# Patient Record
Sex: Male | Born: 1955 | Race: White | Hispanic: No | Marital: Married | Smoking: Never smoker
Health system: Southern US, Community
[De-identification: ages and names within clinical notes are randomized; demographics above are authoritative.]

## PROBLEM LIST (undated history)

## (undated) DIAGNOSIS — E119 Type 2 diabetes mellitus without complications: Secondary | ICD-10-CM

## (undated) DIAGNOSIS — F419 Anxiety disorder, unspecified: Secondary | ICD-10-CM

## (undated) DIAGNOSIS — N189 Chronic kidney disease, unspecified: Secondary | ICD-10-CM

## (undated) DIAGNOSIS — F32A Depression, unspecified: Secondary | ICD-10-CM

## (undated) DIAGNOSIS — Z87442 Personal history of urinary calculi: Secondary | ICD-10-CM

## (undated) DIAGNOSIS — I10 Essential (primary) hypertension: Secondary | ICD-10-CM

## (undated) HISTORY — PX: PROSTATECTOMY: SHX69

## (undated) HISTORY — PX: JOINT REPLACEMENT: SHX530

## (undated) HISTORY — PX: HERNIA REPAIR: SHX51

---

## 2020-04-04 ENCOUNTER — Other Ambulatory Visit (HOSPITAL_COMMUNITY): Payer: Self-pay | Admitting: Student

## 2020-04-04 DIAGNOSIS — R8290 Unspecified abnormal findings in urine: Secondary | ICD-10-CM

## 2020-04-04 DIAGNOSIS — N182 Chronic kidney disease, stage 2 (mild): Secondary | ICD-10-CM

## 2020-04-04 DIAGNOSIS — R809 Proteinuria, unspecified: Secondary | ICD-10-CM

## 2020-04-04 DIAGNOSIS — R319 Hematuria, unspecified: Secondary | ICD-10-CM

## 2020-04-16 ENCOUNTER — Ambulatory Visit (HOSPITAL_COMMUNITY)
Admission: RE | Admit: 2020-04-16 | Discharge: 2020-04-16 | Disposition: A | Discharge status: Home / Self Care | Payer: Medicare Other | Source: Ambulatory Visit | Attending: Student | Admitting: Student

## 2020-04-16 ENCOUNTER — Other Ambulatory Visit: Payer: Self-pay

## 2020-04-16 DIAGNOSIS — R319 Hematuria, unspecified: Secondary | ICD-10-CM | POA: Diagnosis present

## 2020-04-16 DIAGNOSIS — R8290 Unspecified abnormal findings in urine: Secondary | ICD-10-CM | POA: Insufficient documentation

## 2020-04-16 DIAGNOSIS — N182 Chronic kidney disease, stage 2 (mild): Secondary | ICD-10-CM | POA: Diagnosis present

## 2020-04-16 DIAGNOSIS — R809 Proteinuria, unspecified: Secondary | ICD-10-CM | POA: Diagnosis present

## 2020-04-23 ENCOUNTER — Other Ambulatory Visit: Payer: Self-pay | Admitting: Nephrology

## 2020-04-23 DIAGNOSIS — R809 Proteinuria, unspecified: Secondary | ICD-10-CM

## 2020-04-26 NOTE — Progress Notes (Signed)
Patient on schedule for Renal biopsy 3/16, spoke with patient's wife on phone with pre procedure instructions given. Made aware to be here @ 0730 , NPO after MN as well as driver for discharge post procedure/recovery, holding am insulin,metformin, 1/2 dose hs prior to procedure. Stated understanding.

## 2020-04-30 ENCOUNTER — Other Ambulatory Visit: Payer: Self-pay | Admitting: Student

## 2020-05-01 ENCOUNTER — Other Ambulatory Visit: Payer: Self-pay

## 2020-05-01 ENCOUNTER — Ambulatory Visit
Admission: RE | Admit: 2020-05-01 | Discharge: 2020-05-01 | Disposition: A | Discharge status: Home / Self Care | Payer: Medicare Other | Source: Ambulatory Visit | Attending: Nephrology | Admitting: Nephrology

## 2020-05-01 DIAGNOSIS — Z79899 Other long term (current) drug therapy: Secondary | ICD-10-CM | POA: Insufficient documentation

## 2020-05-01 DIAGNOSIS — E119 Type 2 diabetes mellitus without complications: Secondary | ICD-10-CM | POA: Insufficient documentation

## 2020-05-01 DIAGNOSIS — R809 Proteinuria, unspecified: Secondary | ICD-10-CM | POA: Diagnosis present

## 2020-05-01 DIAGNOSIS — Z794 Long term (current) use of insulin: Secondary | ICD-10-CM | POA: Insufficient documentation

## 2020-05-01 DIAGNOSIS — Z880 Allergy status to penicillin: Secondary | ICD-10-CM | POA: Diagnosis not present

## 2020-05-01 DIAGNOSIS — N269 Renal sclerosis, unspecified: Secondary | ICD-10-CM | POA: Insufficient documentation

## 2020-05-01 HISTORY — DX: Essential (primary) hypertension: I10

## 2020-05-01 HISTORY — DX: Type 2 diabetes mellitus without complications: E11.9

## 2020-05-01 HISTORY — DX: Anxiety disorder, unspecified: F41.9

## 2020-05-01 HISTORY — DX: Depression, unspecified: F32.A

## 2020-05-01 HISTORY — DX: Chronic kidney disease, unspecified: N18.9

## 2020-05-01 HISTORY — DX: Personal history of urinary calculi: Z87.442

## 2020-05-01 LAB — CBC
HCT: 45.8 % (ref 39.0–52.0)
Hemoglobin: 15.7 g/dL (ref 13.0–17.0)
MCH: 32.6 pg (ref 26.0–34.0)
MCHC: 34.3 g/dL (ref 30.0–36.0)
MCV: 95.2 fL (ref 80.0–100.0)
Platelets: 324 10*3/uL (ref 150–400)
RBC: 4.81 MIL/uL (ref 4.22–5.81)
RDW: 13.3 % (ref 11.5–15.5)
WBC: 11 10*3/uL — ABNORMAL HIGH (ref 4.0–10.5)
nRBC: 0 % (ref 0.0–0.2)

## 2020-05-01 LAB — GLUCOSE, CAPILLARY: Glucose-Capillary: 113 mg/dL — ABNORMAL HIGH (ref 70–99)

## 2020-05-01 LAB — PROTIME-INR
INR: 1 (ref 0.8–1.2)
Prothrombin Time: 13.2 seconds (ref 11.4–15.2)

## 2020-05-01 MED ORDER — MIDAZOLAM HCL 2 MG/2ML IJ SOLN
INTRAMUSCULAR | Status: AC
  Filled 2020-05-01: qty 2

## 2020-05-01 MED ORDER — MIDAZOLAM HCL 2 MG/2ML IJ SOLN
INTRAMUSCULAR | Status: AC | PRN
  Administered 2020-05-01 (×2): 1 mg via INTRAVENOUS

## 2020-05-01 MED ORDER — FENTANYL CITRATE (PF) 100 MCG/2ML IJ SOLN
INTRAMUSCULAR | Status: AC | PRN
Start: 2020-05-01 — End: 2020-05-01
  Administered 2020-05-01 (×2): 50 ug via INTRAVENOUS

## 2020-05-01 MED ORDER — SODIUM CHLORIDE 0.9 % IV SOLN: INTRAVENOUS | Status: DC

## 2020-05-01 MED ORDER — FENTANYL CITRATE (PF) 100 MCG/2ML IJ SOLN
INTRAMUSCULAR | Status: AC
  Filled 2020-05-01: qty 2

## 2020-05-01 NOTE — Discharge Instructions (Signed)
Liver Biopsy, Care After These instructions give you information about how to care for yourself after your procedure. Your health care provider may also give you more specific instructions. If you have problems or questions, contact your health care provider. What can I expect after the procedure? After your procedure, it is common to have:  Pain and soreness in the area where the biopsy was done.  Bruising around the area where the biopsy was done.  Sleepiness and fatigue for 1-2 days. Follow these instructions at home: Medicines  Take over-the-counter and prescription medicines only as told by your health care provider.  If you were prescribed an antibiotic medicine, take it as told by your health care provider. Do not stop taking the antibiotic even if you start to feel better.  Do not take medicines such as aspirin and ibuprofen unless your health care provider tells you to take them. These medicines thin your blood and can increase the risk of bleeding.  If you are taking prescription pain medicine, take actions to prevent or treat constipation. Your health care provider may recommend that you: ? Drink enough fluid to keep your urine pale yellow. ? Eat foods that are high in fiber, such as fresh fruits and vegetables, whole grains, and beans. ? Limit foods that are high in fat and processed sugars, such as fried or sweet foods. ? Take an over-the-counter or prescription medicine for constipation. Incision care  Follow instructions from your health care provider about how to take care of your incision. Make sure you: ? Wash your hands with soap and water before you change your bandage (dressing). If soap and water are not available, use hand sanitizer. ? Change your dressing as told by your health care provider. ? Leave stitches (sutures), skin glue, or adhesive strips in place. These skin closures may need to stay in place for 2 weeks or longer. If adhesive strip edges start to  loosen and curl up, you may trim the loose edges. Do not remove adhesive strips completely unless your health care provider tells you to do that.  Check your incision area every day for signs of infection. Check for: ? Redness, swelling, or pain. ? Fluid or blood. ? Warmth. ? Pus or a bad smell.  Do not take baths, swim, or use a hot tub until your health care provider says it is okay to do so. Activity  Rest at home for 1-2 days, or as directed by your health care provider. ? Avoid sitting for a long time without moving. Get up to take short walks every 1-2 hours. This is important to improve blood flow and breathing. Ask for help if you feel weak or unsteady.  Return to your normal activities as told by your health care provider. Ask your health care provider what activities are safe for you.  Do not drive or use heavy machinery while taking prescription pain medicine.  Do not lift anything that is heavier than 10 lb (4.5 kg), or the limit that your health care provider tells you, until he or she says that it is safe.  Do not play contact sports for 2 weeks after the procedure.   General instructions  Do not drink alcohol in the first week after the procedure.  Have someone stay with you for at least 24 hours after the procedure.  It is your responsibility to obtain your test results. Ask your health care provider, or the department that is doing the test: ? When will my   results be ready? ? How will I get my results? ? What are my treatment options? ? What other tests do I need? ? What are my next steps?  Keep all follow-up visits as told by your health care provider. This is important.   Contact a health care provider if:  You have increased bleeding from an incision, resulting in more than a small spot of blood.  You have redness, swelling, or increasing pain in any incisions.  You notice a discharge or a bad smell coming from any of your incisions.  You have a fever or  chills. Get help right away if:  You develop swelling, bloating, or pain in your abdomen.  You become dizzy or faint.  You develop a rash.  You have nausea or you vomit.  You faint, or you have shortness of breath or difficulty breathing.  You develop chest pain.  You have problems with your speech or vision.  You have trouble with your balance or moving your arms or legs. Summary  After the liver biopsy, it is common to have pain, soreness, and bruising in the area, as well as sleepiness and fatigue.  Take over-the-counter and prescription medicines only as told by your health care provider.  Follow instructions from your health care provider about how to care for your incision. Check the incision area daily for signs of infection. This information is not intended to replace advice given to you by your health care provider. Make sure you discuss any questions you have with your health care provider. Document Revised: 03/28/2018 Document Reviewed: 02/12/2017 Elsevier Patient Education  2021 Elsevier Inc.  

## 2020-05-01 NOTE — Progress Notes (Signed)
Patient clinically stable post Renal biopsy per DR Suttle, tolerated well with stable vitals pre and post procedure. Awake/alert and oriented post procedure. Denies complaints at this time. Report given to [facility] post procedure/specials.

## 2020-05-01 NOTE — Procedures (Signed)
Interventional Radiology Procedure Note  Procedure: Ultrasound guided non-focal renal biopsy  Findings: Please refer to procedural dictation for full description. Left lower pole 16 ga core x2.  Gelfoam needle track embolization.  Complications: None immeidate  Estimated Blood Loss: < 5 mL  Recommendations: Strict 3 hours bedrest. Follow up Pathology results.   Marliss Coots, MD

## 2020-05-01 NOTE — H&P (Signed)
Chief Complaint: Patient was seen in consultation today for kidney biopsy  Referring Physician(s): Kolluru,Sarath  Patient Status: [facility] - Out-pt  History of Present Illness: Stuart Mosley is a 65 y.o. male with history of proteinuria presenting for non-focal renal biopsy.  Denies fevers, chills, chest pain, shortness of breath, nausea, vomiting, abdominal pain, hematuria.  Past Medical History:  Diagnosis Date  . Anxiety   . Chronic kidney disease   . Depression   . Diabetes mellitus without complication (HCC)   . History of kidney stones   . Hypertension     Past Surgical History:  Procedure Laterality Date  . HERNIA REPAIR    . JOINT REPLACEMENT     left hip  . PROSTATECTOMY      Allergies: Penicillins and Tetracyclines & related  Medications: Prior to Admission medications   Medication Sig Start Date End Date Taking? Authorizing Provider  colchicine 0.6 MG tablet Take 0.6 mg by mouth daily.   Yes [provider]  FLAXSEED, LINSEED, PO Take by mouth.   Yes [provider]  fluticasone (FLONASE) 50 MCG/ACT nasal spray Place into both nostrils daily.   Yes [provider]  folic acid (FOLVITE) 1 MG tablet Take 1 mg by mouth daily.   Yes [provider]  insulin glargine (LANTUS) 100 UNIT/ML injection Inject 41 Units into the skin daily.   Yes [provider]  lisinopril (ZESTRIL) 10 MG tablet Take 10 mg by mouth daily.   Yes [provider]  metFORMIN (GLUMETZA) 1000 MG (MOD) 24 hr tablet Take 1,000 mg by mouth 2 (two) times daily with a meal.   Yes [provider]  pravastatin (PRAVACHOL) 80 MG tablet Take 80 mg by mouth daily.   Yes [provider]     History reviewed. No pertinent family history.  Social History   Socioeconomic History  . Marital status: Married    Spouse name: Not on file  . Number of children: Not on file  . Years of education: Not on file  . Highest education  level: Not on file  Occupational History  . Not on file  Tobacco Use  . Smoking status: Never Smoker  . Smokeless tobacco: Never Used  Substance and Sexual Activity  . Alcohol use: Not Currently  . Drug use: Never  . Sexual activity: Not on file  Other Topics Concern  . Not on file  Social History Narrative  . Not on file   Social Determinants of Health   Financial Resource Strain: Not on file  Food Insecurity: Not on file  Transportation Needs: Not on file  Physical Activity: Not on file  Stress: Not on file  Social Connections: Not on file   Review of Systems: A 12 point ROS discussed and pertinent positives are indicated in the HPI above.  All other systems are negative.  Vital Signs: BP (!) 156/97   Pulse 81   Temp 98.4 F (36.9 C) (Oral)   Resp 17   Ht 5\' 9"  (1.753 m)   Wt 116.6 kg   SpO2 97%   BMI 37.95 kg/m   Physical Exam Constitutional:      General: He is not in acute distress. HENT:     Head: Normocephalic.     Mouth/Throat:     Mouth: Mucous membranes are moist.     Comments: MP2 Cardiovascular:     Rate and Rhythm: Normal rate and regular rhythm.     Heart sounds: Normal heart sounds.  Pulmonary:     Effort: Pulmonary effort is normal. No respiratory distress.     Breath sounds: Normal breath sounds.  Abdominal:     General: There is no distension.  Musculoskeletal:        General: No swelling.  Skin:    General: Skin is warm and dry.  Neurological:     Mental Status: He is alert and oriented to person, place, and time.     Imaging: US RENAL  Result Date: 04/17/2020 CLINICAL DATA:  Initial evaluation for chronic kidney disease, abnormal urine findings. EXAM: RENAL / URINARY TRACT ULTRASOUND COMPLETE COMPARISON:  Prior ultrasound from 01/02/2016. FINDINGS: Right Kidney: Renal measurements: 13.4 x 7.2 x 6.4 cm = volume: 323.6 mL. Renal echogenicity within normal limits. No nephrolithiasis or hydronephrosis. 2.0 x 1.5 x 1.9 cm simple cyst  present at the lower pole. Additional 1.3 x 1.0 x 1.1 cm simple cyst present at the interpolar region. Left Kidney: Renal measurements: 12.4 x 6.5 x 6.5 cm = volume: 232.5 mL. Renal echogenicity within normal limits. No nephrolithiasis or hydronephrosis. No focal renal mass. Bladder: Decompressed and not well assessed. Other: None. IMPRESSION: 1. Two simple right renal cysts measuring up to 2.0 cm as above. 2. Otherwise unremarkable and normal renal ultrasound. No sonographic evidence for nephrolithiasis or hydronephrosis. Electronically Signed   By: Rise Mu M.D.   On: 04/17/2020 04:44    Labs:  CBC: No results for input(s): WBC, HGB, HCT, PLT in the last 8760 hours.  COAGS: No results for input(s): INR, APTT in the last 8760 hours.  BMP: No results for input(s): NA, K, CL, CO2, GLUCOSE, BUN, CALCIUM, CREATININE, GFRNONAA, GFRAA in the last 8760 hours.  Invalid input(s): CMP  LIVER FUNCTION TESTS: No results for input(s): BILITOT, AST, ALT, ALKPHOS, PROT, ALBUMIN in the last 8760 hours.  TUMOR MARKERS: No results for input(s): AFPTM, CEA, CA199, CHROMGRNA in the last 8760 hours.  Assessment and Plan:  65 year old male with history of proteinuria.  Plan for ultrasound guided non-focal renal biopsy with moderate sedation.   Electronically Signed: Bennie Dallas, MD 05/01/2020, 8:02 AM   I spent a total of  15 Minutes in face to face in clinical consultation, greater than 50% of which was counseling/coordinating care for renal biopsy.

## 2020-05-02 LAB — GLUCOSE, CAPILLARY: Glucose-Capillary: 33 mg/dL — CL (ref 70–99)

## 2020-06-12 ENCOUNTER — Other Ambulatory Visit: Payer: Self-pay | Admitting: Family Medicine

## 2020-06-12 ENCOUNTER — Other Ambulatory Visit (HOSPITAL_COMMUNITY): Payer: Self-pay | Admitting: Family Medicine

## 2020-06-12 DIAGNOSIS — N5089 Other specified disorders of the male genital organs: Secondary | ICD-10-CM

## 2020-06-14 ENCOUNTER — Ambulatory Visit (HOSPITAL_COMMUNITY)
Admission: RE | Admit: 2020-06-14 | Discharge: 2020-06-14 | Disposition: A | Discharge status: Home / Self Care | Payer: Medicare Other | Source: Ambulatory Visit | Attending: Family Medicine | Admitting: Family Medicine

## 2020-06-14 ENCOUNTER — Other Ambulatory Visit: Payer: Self-pay

## 2020-06-14 DIAGNOSIS — N5089 Other specified disorders of the male genital organs: Secondary | ICD-10-CM | POA: Insufficient documentation

## 2020-08-01 ENCOUNTER — Encounter: Payer: Self-pay | Admitting: Nephrology

## 2020-08-01 LAB — SURGICAL PATHOLOGY

## 2021-04-23 IMAGING — US US BIOPSY
1 series · 7 of 7 positions shown · non-contrast
Comparison: None.

INDICATION: 64-year-old male with history of proteinuria.

EXAM:
ULTRASOUND GUIDED RENAL BIOPSY

[Series 1: us biopsy · 7 of 7 slices shown]
[im 1/7]
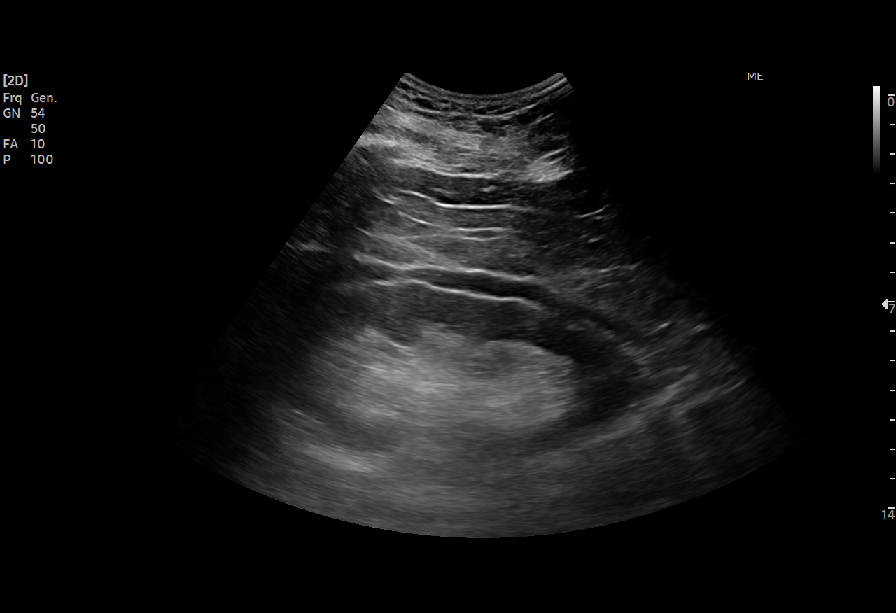
[im 2/7]
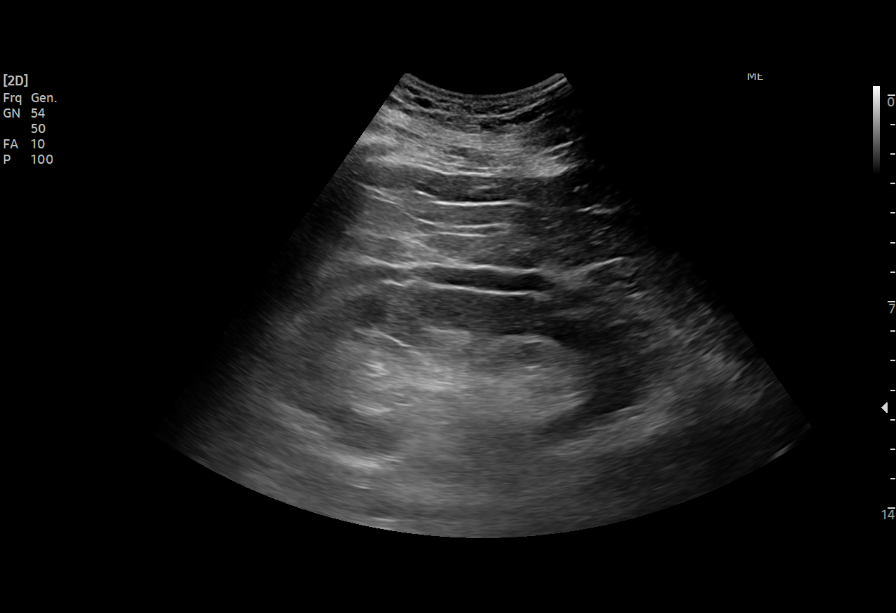
[im 3/7]
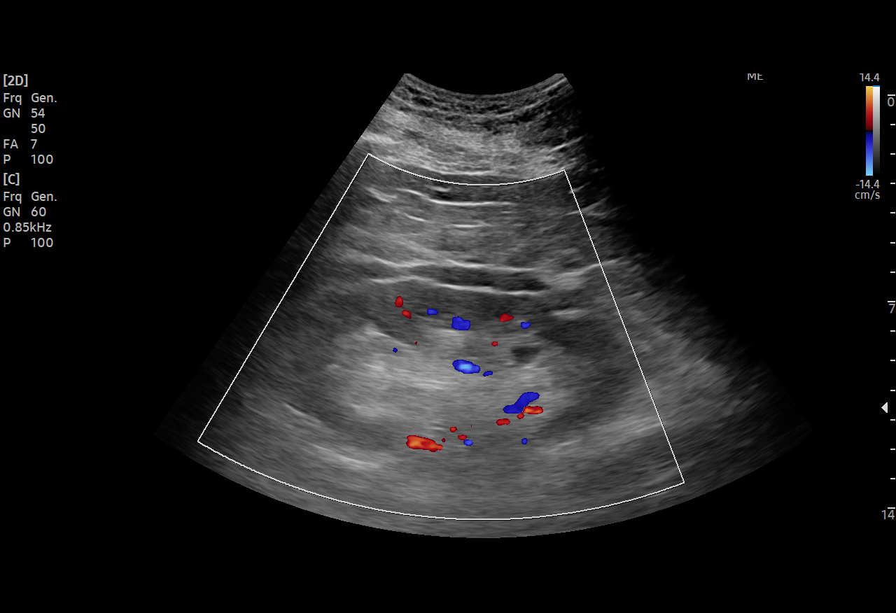
[im 4/7]
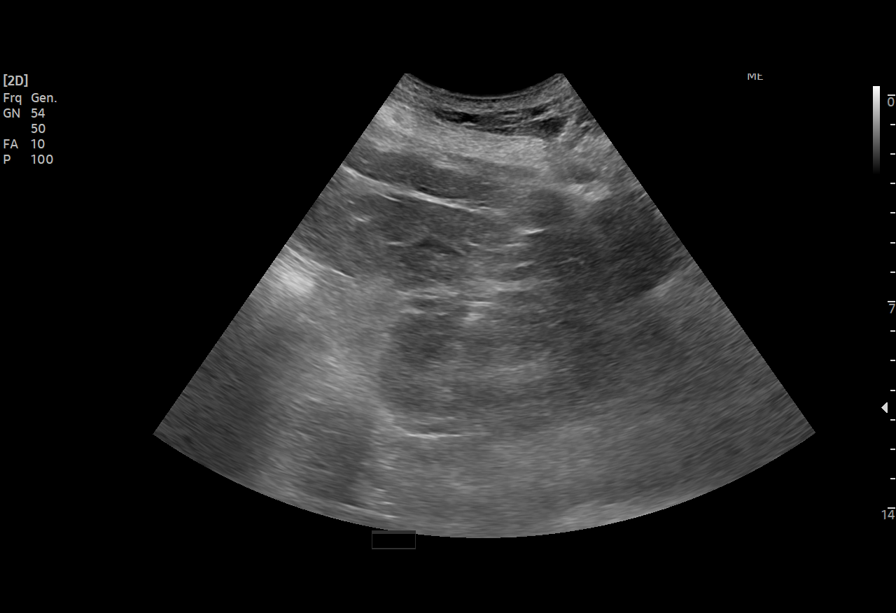
[im 5/7]
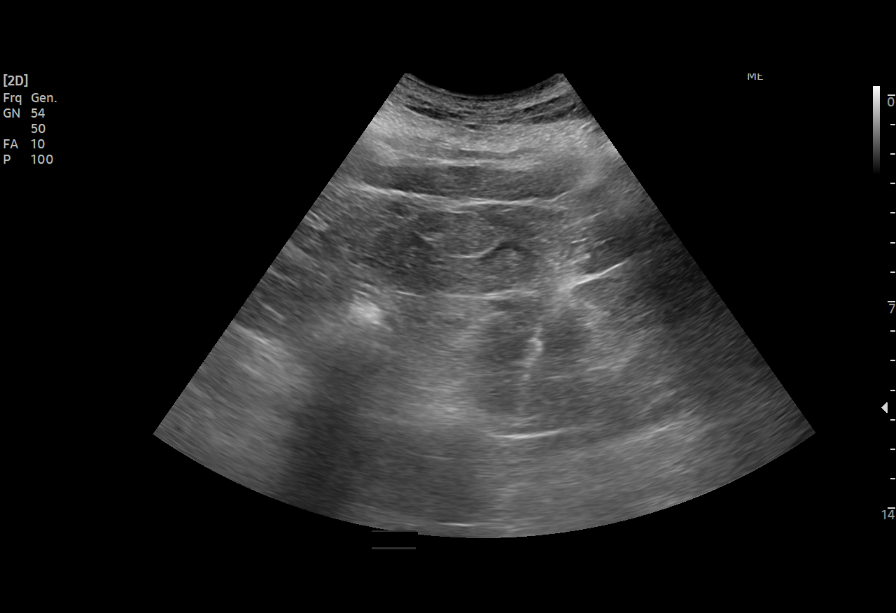
[im 6/7]
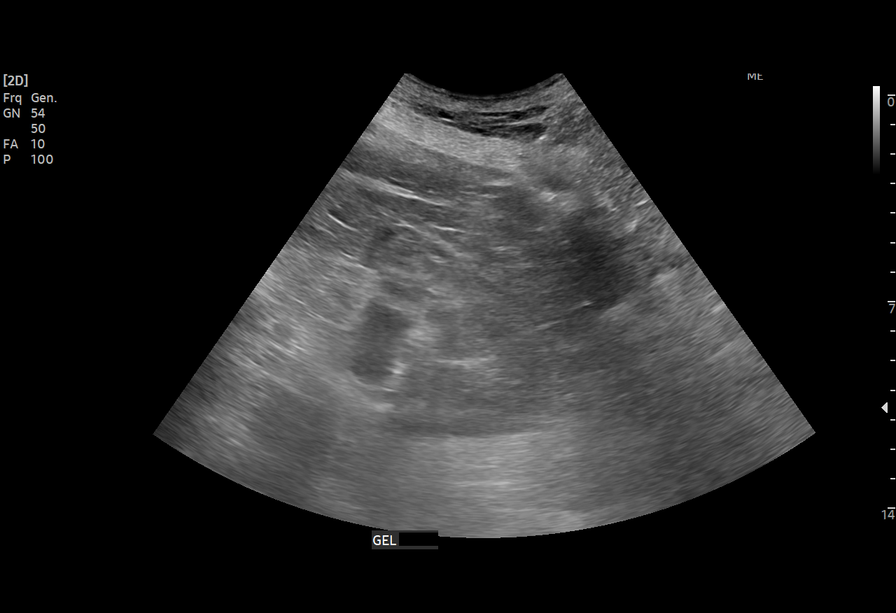
[im 7/7]
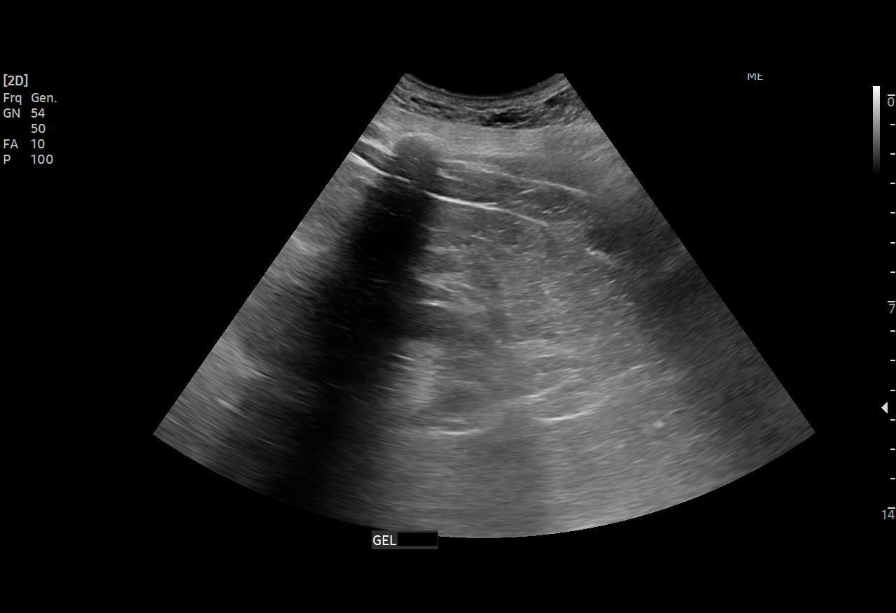

[7 of 7 positions shown; findings below may reference images not displayed]

MEDICATIONS:
None.

ANESTHESIA/SEDATION:
Fentanyl 100 mcg IV; Versed 2 mg IV

Total Moderate Sedation time: 16 minutes; The patient was
continuously monitored during the procedure by the interventional
radiology nurse under my direct supervision.

COMPLICATIONS:
None immediate.

PROCEDURE:
Informed written consent was obtained from the patient after a
discussion of the risks, benefits and alternatives to treatment. The
patient understands and consents the procedure. A timeout was
performed prior to the initiation of the procedure.

Ultrasound scanning was performed of the bilateral flanks. The
inferior pole of the left kidney was selected for biopsy due to
location and sonographic window. The procedure was planned. The
operative site was prepped and draped in the usual sterile fashion.
The overlying soft tissues were anesthetized with 1% lidocaine with
epinephrine. A 15 gauge needle guide was advanced into the inferior
cortex of the left kidney through which a 16 gauge biopsy device was
advanced and 2 core biopsies were obtained under direct ultrasound
guidance. Images were saved for documentation purposes. The biopsy
device was removed while injecting Gel-Foam slurry along the needle
track and hemostasis was obtained with manual compression. Post
procedural scanning was negative for significant post procedural
hemorrhage or additional complication. A dressing was placed. The
patient tolerated the procedure well without immediate post
procedural complication.
IMPRESSION: Technically successful ultrasound guided left renal biopsy.

## 2021-06-06 IMAGING — US US SCROTUM W/ DOPPLER COMPLETE
1 series · 14 of 25 positions shown · non-contrast
Comparison: None.

CLINICAL DATA: Scrotal mass in the right x2 weeks. Right-sided
pain.

EXAM:
SCROTAL ULTRASOUND
DOPPLER ULTRASOUND OF THE TESTICLES
TECHNIQUE: Complete ultrasound examination of the testicles, epididymis, and
other scrotal structures was performed. Color and spectral Doppler
ultrasound were also utilized to evaluate blood flow to the
testicles.

[Series 1: us scrotum w/doppler · 14 of 72 slices shown]
[im 1/72]
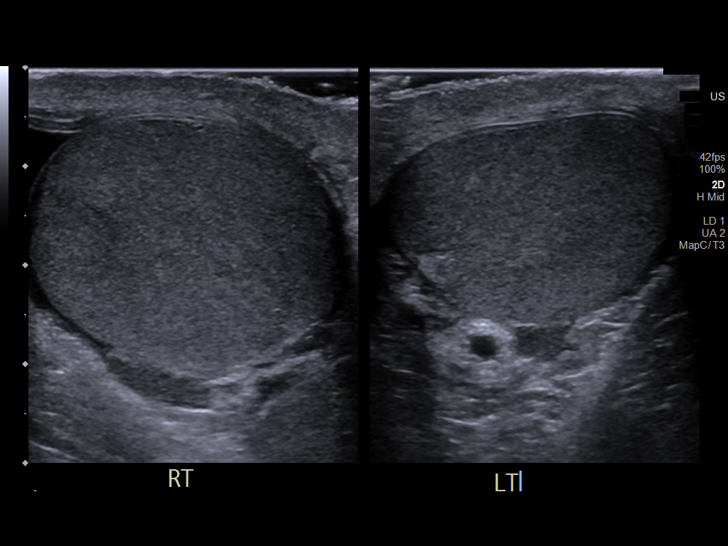
[im 6/72]
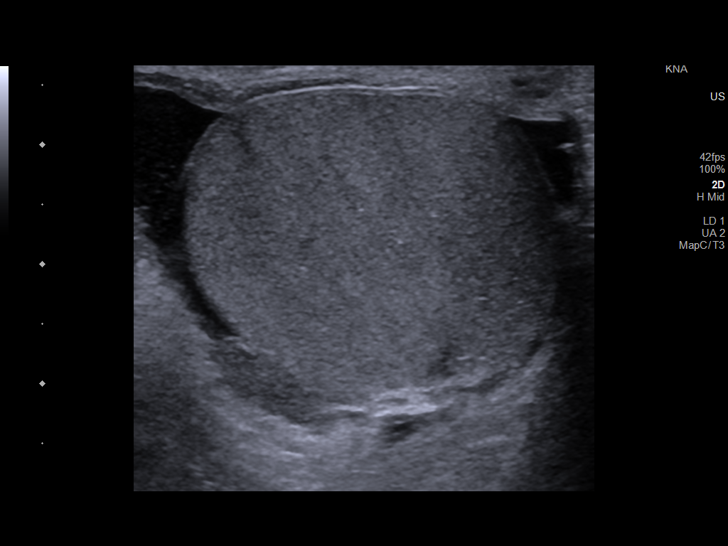
[im 12/72]
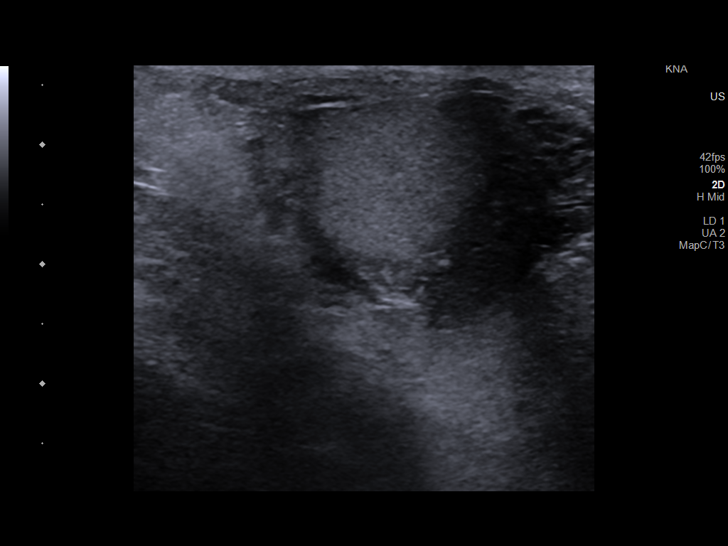
[im 18/72]
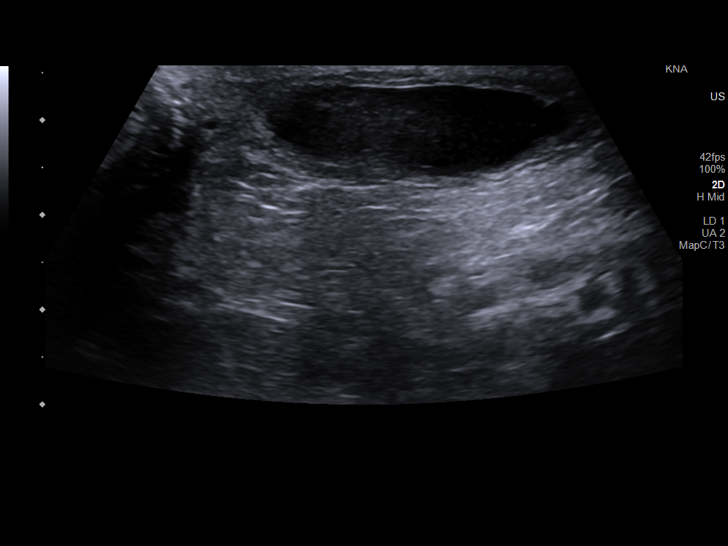
[im 24/72]
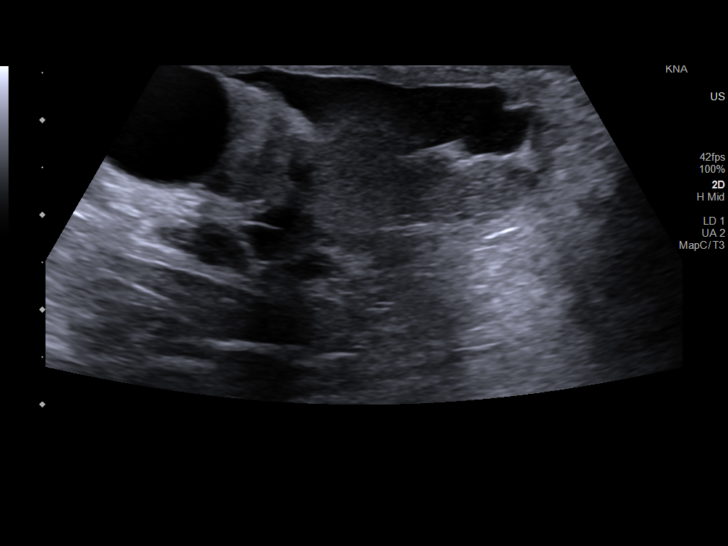
[im 27/72]
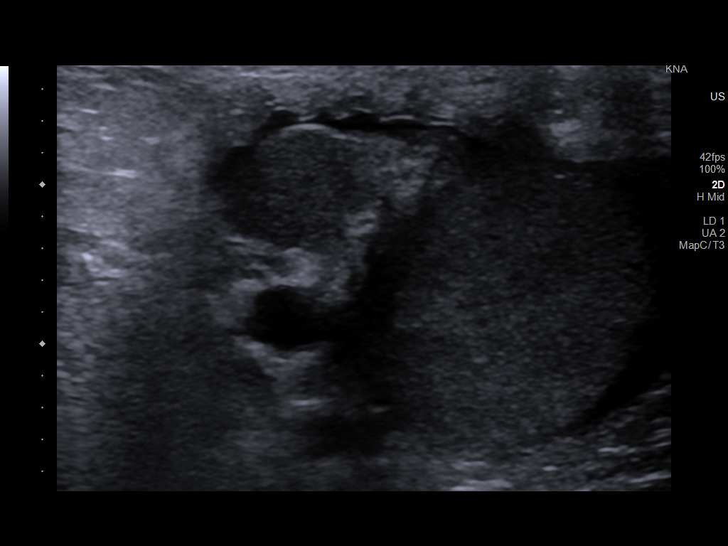
[im 33/72]
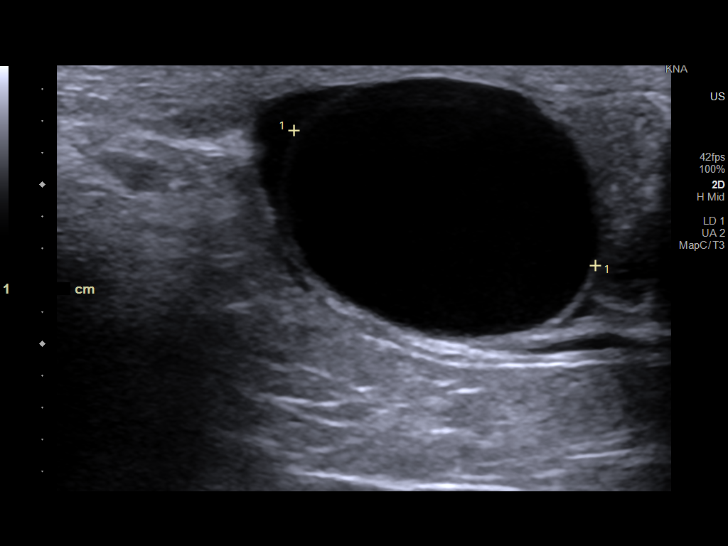
[im 39/72]
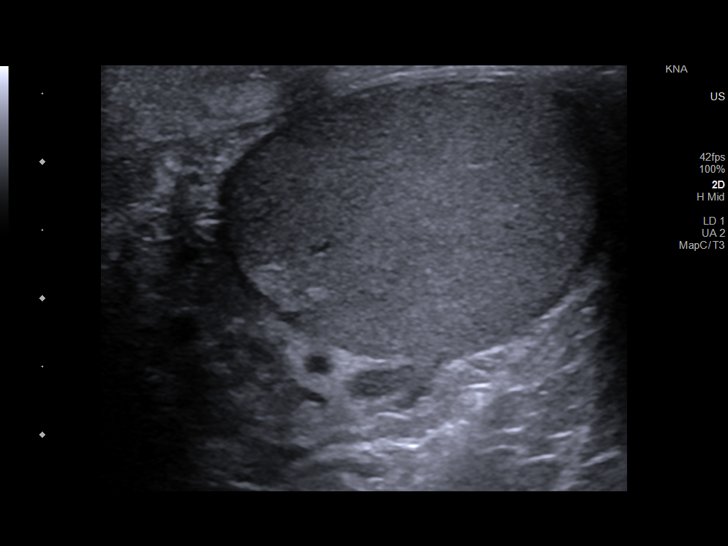
[im 45/72]
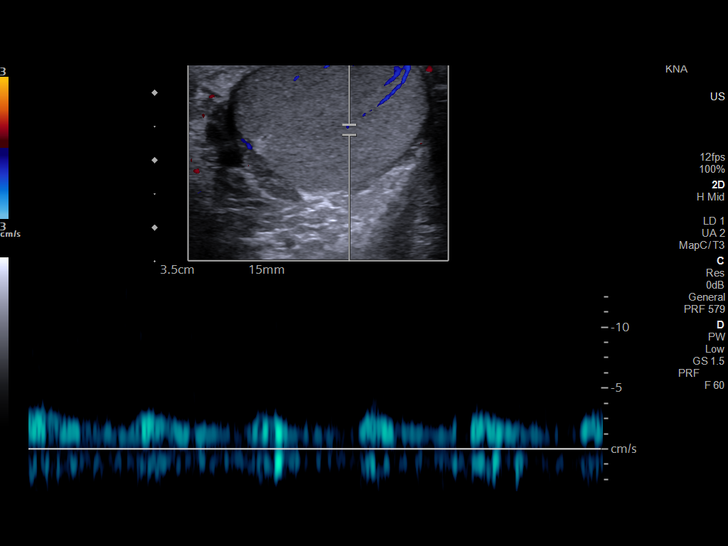
[im 48/72]
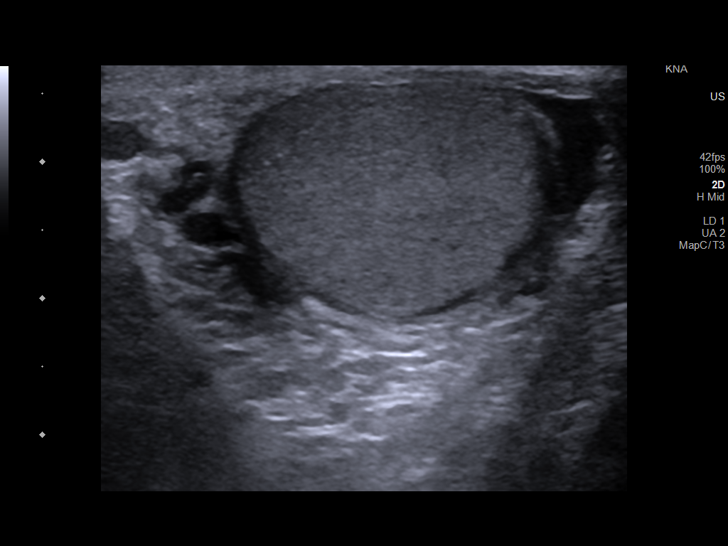
[im 54/72]
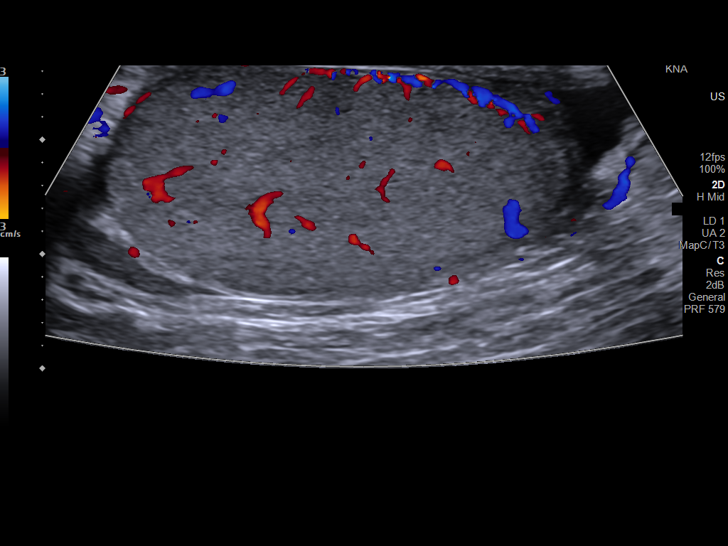
[im 60/72]
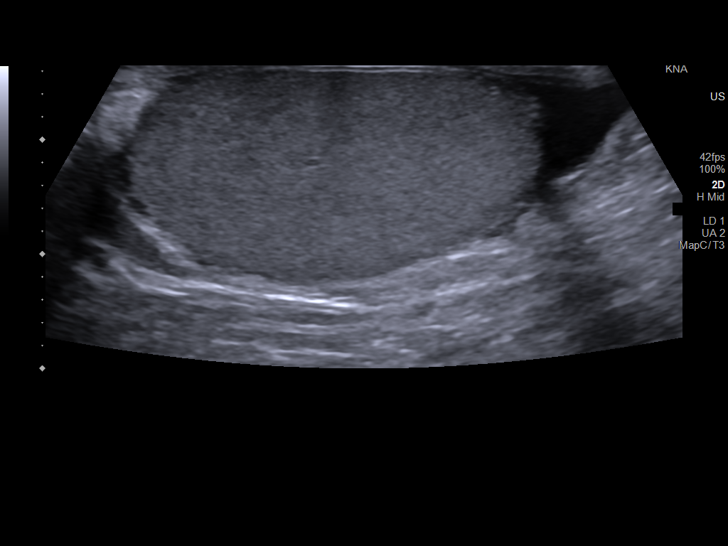
[im 66/72]
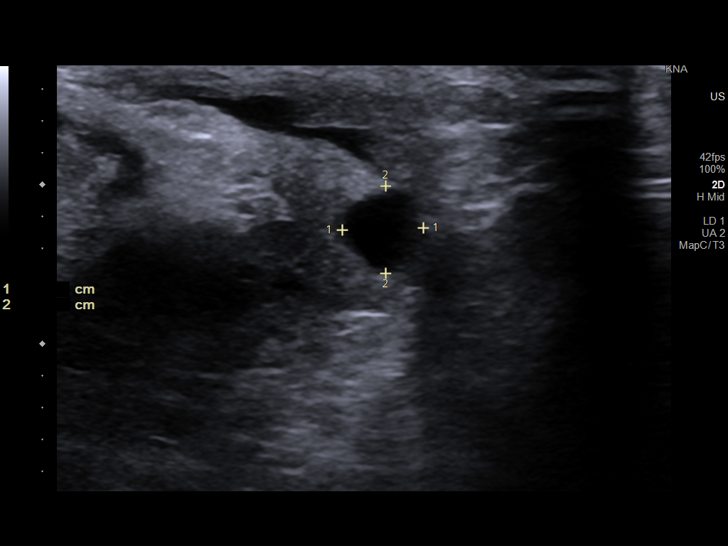
[im 72/72]
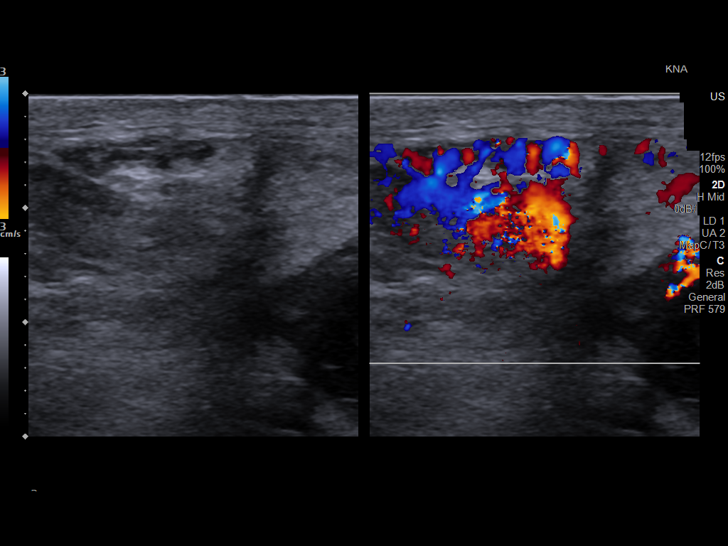

[14 of 25 positions shown; findings below may reference images not displayed]

FINDINGS: Right testicle

Measurements: 3.6 x 2.7 x 3.2 cm. No mass or microlithiasis
visualized.

Left testicle

Measurements: 4.3 x 2 x 2.2 cm. No mass or microlithiasis
visualized.

Right epididymis: There is a right-sided epididymal cyst measuring
2.1 cm.

Left epididymis:  There is a small epididymal cyst measuring 6 mm.

Hydrocele:  There are small bilateral hydroceles.

Varicocele:  None visualized.

Pulsed Doppler interrogation of both testes demonstrates normal low
resistance arterial and venous waveforms bilaterally.
IMPRESSION: 1. No acute abnormality.
2. There is a right-sided epididymal cyst measuring 2.1 cm.

## 2022-02-25 ENCOUNTER — Other Ambulatory Visit (HOSPITAL_COMMUNITY): Payer: Self-pay

## 2022-02-25 MED ORDER — HUMALOG TEMPO PEN 100 UNIT/ML ~~LOC~~ SOPN
PEN_INJECTOR | SUBCUTANEOUS | 5 refills | Status: AC
  Filled 2022-03-04: qty 15, 84d supply, fill #0
  Filled 2022-03-05 – 2022-03-06 (×3): qty 15, 83d supply, fill #0

## 2022-02-25 MED ORDER — ONETOUCH VERIO VI STRP
ORAL_STRIP | 1 refills | Status: AC
  Filled 2022-03-04 (×2): qty 300, 100d supply, fill #0
  Filled 2022-06-07: qty 300, 100d supply, fill #1

## 2022-02-25 MED ORDER — ICOSAPENT ETHYL 1 G PO CAPS
1.0000 g | ORAL_CAPSULE | Freq: Two times a day (BID) | ORAL | 3 refills | Status: AC
  Filled 2022-03-04: qty 60, 30d supply, fill #0
  Filled 2022-03-05: qty 120, 60d supply, fill #0
  Filled 2022-05-01: qty 120, 60d supply, fill #1

## 2022-02-25 MED ORDER — TEMPO WELCOME W/DEVICE KIT
PACK | 0 refills | Status: AC
  Filled 2022-03-04: qty 1, 30d supply, fill #0

## 2022-02-26 ENCOUNTER — Other Ambulatory Visit (HOSPITAL_COMMUNITY): Payer: Self-pay

## 2022-03-04 ENCOUNTER — Other Ambulatory Visit (HOSPITAL_COMMUNITY): Payer: Self-pay

## 2022-03-05 ENCOUNTER — Other Ambulatory Visit: Payer: Self-pay

## 2022-03-05 ENCOUNTER — Other Ambulatory Visit (HOSPITAL_COMMUNITY): Payer: Self-pay

## 2022-03-06 ENCOUNTER — Other Ambulatory Visit: Payer: Self-pay

## 2022-03-06 ENCOUNTER — Other Ambulatory Visit (HOSPITAL_COMMUNITY): Payer: Self-pay

## 2022-03-09 ENCOUNTER — Other Ambulatory Visit (HOSPITAL_COMMUNITY): Payer: Self-pay

## 2022-03-24 ENCOUNTER — Other Ambulatory Visit (HOSPITAL_COMMUNITY): Payer: Self-pay

## 2022-05-02 ENCOUNTER — Other Ambulatory Visit (HOSPITAL_COMMUNITY): Payer: Self-pay

## 2022-05-04 ENCOUNTER — Other Ambulatory Visit: Payer: Self-pay

## 2022-06-08 ENCOUNTER — Other Ambulatory Visit: Payer: Self-pay

## 2022-07-15 ENCOUNTER — Other Ambulatory Visit (HOSPITAL_COMMUNITY): Payer: Self-pay

## 2022-09-29 ENCOUNTER — Other Ambulatory Visit (HOSPITAL_COMMUNITY): Payer: Self-pay

## 2022-10-13 ENCOUNTER — Other Ambulatory Visit (HOSPITAL_COMMUNITY): Payer: Self-pay

## 2022-10-13 ENCOUNTER — Other Ambulatory Visit: Payer: Self-pay

## 2022-10-13 MED ORDER — GLUCOSE BLOOD VI STRP
ORAL_STRIP | Freq: Four times a day (QID) | 1 refills | Status: DC
  Filled 2022-10-13: qty 500, 100d supply, fill #0
  Filled 2023-01-18: qty 400, 90d supply, fill #1
  Filled 2023-04-18: qty 100, 25d supply, fill #2

## 2022-11-07 ENCOUNTER — Other Ambulatory Visit (HOSPITAL_COMMUNITY): Payer: Self-pay

## 2023-01-19 ENCOUNTER — Other Ambulatory Visit: Payer: Self-pay

## 2023-04-19 ENCOUNTER — Other Ambulatory Visit: Payer: Self-pay

## 2023-04-19 ENCOUNTER — Other Ambulatory Visit (HOSPITAL_COMMUNITY): Payer: Self-pay

## 2023-09-06 ENCOUNTER — Other Ambulatory Visit (HOSPITAL_COMMUNITY): Payer: Self-pay

## 2023-09-06 ENCOUNTER — Other Ambulatory Visit: Payer: Self-pay

## 2023-09-06 MED ORDER — ONETOUCH VERIO VI STRP
ORAL_STRIP | 1 refills | Status: AC
  Filled 2023-09-06: qty 500, 100d supply, fill #0
  Filled 2024-02-22 – 2024-02-23 (×2): qty 500, 100d supply, fill #1
  Filled 2024-03-21: qty 400, 100d supply, fill #1
  Filled 2024-03-23 (×3): qty 500, 100d supply, fill #1

## 2023-09-17 ENCOUNTER — Encounter: Payer: Self-pay | Admitting: Urology

## 2023-09-17 ENCOUNTER — Ambulatory Visit: Admitting: Urology

## 2023-09-17 VITALS — BP 129/82 | HR 79

## 2023-09-17 DIAGNOSIS — C61 Malignant neoplasm of prostate: Secondary | ICD-10-CM

## 2023-09-17 NOTE — Patient Instructions (Signed)
 Scheduling number: [id number]

## 2023-09-17 NOTE — Progress Notes (Signed)
 I, Stuart Mosley, acting as a scribe for Stuart JAYSON Barba, MD., have documented all relevant documentation on the behalf of Stuart JAYSON Barba, MD, as directed by Stuart JAYSON Barba, MD while in the presence of Stuart JAYSON Barba, MD.  09/17/2023 3:09 PM   Stuart Mosley Mar 30, 1955 [id number]  Referring provider: Halbert Mariano SQUIBB, DO [address] [place],  [place] [address]   HPI: Stuart Mosley is a 68 y.o. male referred for prostate cancer. His wife was with him today.  Status post RALP August 2015 for Gleason 4+4 had no carcinoma. Surgery performed at [facility] by Dr. Julieann. Final pathology showed a positive margin at the posterior bladder neck and right lateral prostate; seminal vesicles were free of tumor. Pre-op PSA was 16.3. Initial post-op PSA was 0. Underwent IMRT to pelvis completed 06/06/2014. ADT initiated 03/2014. PSA undetectable after completion of radiation initially. On record review, his PSA was 3.0 in August 2024, 2.9 in December 2024, 3.5 in March 2025, and 3.9 in June 2025. He has no bony pain or pelvic pain. He has been incontinent since his prostatectomy, which has not worsened.   PMH: Past Medical History:  Diagnosis Date   Anxiety    Chronic kidney disease    Depression    Diabetes mellitus without complication (HCC)    History of kidney stones    Hypertension     Surgical History: Past Surgical History:  Procedure Laterality Date   HERNIA REPAIR     JOINT REPLACEMENT     left hip   PROSTATECTOMY      Home Medications:  Allergies as of 09/17/2023       Reactions   Penicillins Rash   Tetracyclines & Related Rash        Medication List        Accurate as of September 17, 2023  3:09 PM. If you have any questions, ask your nurse or doctor.          aspirin EC 81 MG tablet TAKE 1 TABLET BY MOUTH ONCE DAILY.; Duration: 90   Cholecalciferol 50 MCG (2000 UT) Caps TAKE 1 CAPSULE BY MOUTH ONCE DAILY.; Duration: 90   colchicine 0.6 MG  tablet Take 0.6 mg by mouth daily.   fenofibrate 145 MG tablet Commonly known as: TRICOR 1 tablet Orally Once a day; Duration: 90 days   FLAXSEED (LINSEED) PO Take by mouth.   fluticasone 50 MCG/ACT nasal spray Commonly known as: FLONASE Place into both nostrils daily.   folic acid 1 MG tablet Commonly known as: FOLVITE Take 1 mg by mouth daily.   HumaLOG  Tempo Pen 100 UNIT/ML Sopn Generic drug: Insulin  Lispro w/ Trans Port Inject 3 times a day with meals if bs is >150 add 2 units. If >200 add 4 units. If >250 add 6 units If >300 add 6 u (max 540 units a month)   icosapent  Ethyl 1 g capsule Commonly known as: Vascepa  Take 1 capsule (1 g total) by mouth 2 (two) times daily.   insulin  glargine 100 UNIT/ML injection Commonly known as: LANTUS Inject 41 Units into the skin daily.   Jardiance 25 MG Tabs tablet Generic drug: empagliflozin Take 25 mg by mouth daily.   lisinopril 10 MG tablet Commonly known as: ZESTRIL Take 10 mg by mouth daily.   metFORMIN 1000 MG (MOD) 24 hr tablet Commonly known as: GLUMETZA Take 1,000 mg by mouth 2 (two) times daily with a meal.   OneTouch Verio test  strip Generic drug: glucose blood Use to test blood sugar 3 times a day   OneTouch Verio test strip Generic drug: glucose blood use as directed four times daily and as needed   pravastatin 80 MG tablet Commonly known as: PRAVACHOL Take 80 mg by mouth daily.   rosuvastatin 40 MG tablet Commonly known as: CRESTOR Take 40 mg by mouth daily.   Slow-Mag 71.5-119 MG Tbec SR tablet Generic drug: magnesium chloride TAKE 1 TABLET BY MOUTH TWICE DAILY.; Duration: 90 days   Tempo Welcome w/Device Kit Use as directed in injet insulin .   UltiCare Mini Pen Needles 31G X 6 MM Misc Generic drug: Insulin  Pen Needle See admin instructions.        Allergies:  Allergies  Allergen Reactions   Penicillins Rash   Tetracyclines & Related Rash    Social History:  reports that he has  never smoked. He has never used smokeless tobacco. He reports that he does not currently use alcohol. He reports that he does not use drugs.   Physical Exam: BP 129/82   Pulse 79   Constitutional:  Alert and oriented, No acute distress. HEENT: [place] AT Respiratory: Normal respiratory effort, no increased work of breathing. GU: Anterior rectal wall smooth without masses, nodules, or induration.  Psychiatric: Normal mood and affect.   Assessment & Plan:    1. Prostate cancer Status post RALP August 2015 for pT3N0M0 adenocarcinoma prostate-ductal variant.  Post-op radiation therapy to pelvis +6 months ADT.  We discussed his rising PSA is consistent with recurrent disease.  Recommend further evaluation with PSMA/PET  I have reviewed the above documentation for accuracy and completeness, and I agree with the above.   Stuart JAYSON Barba, MD  [facility] [address], [address] [place], [place] [address] [phone]

## 2023-10-01 ENCOUNTER — Ambulatory Visit
Admission: RE | Admit: 2023-10-01 | Discharge: 2023-10-01 | Disposition: A | Discharge status: Home / Self Care | Source: Ambulatory Visit | Attending: Urology | Admitting: Urology

## 2023-10-01 DIAGNOSIS — R9721 Rising PSA following treatment for malignant neoplasm of prostate: Secondary | ICD-10-CM | POA: Diagnosis not present

## 2023-10-01 DIAGNOSIS — N1330 Unspecified hydronephrosis: Secondary | ICD-10-CM | POA: Diagnosis not present

## 2023-10-01 DIAGNOSIS — C61 Malignant neoplasm of prostate: Secondary | ICD-10-CM | POA: Diagnosis present

## 2023-10-01 MED ORDER — FLOTUFOLASTAT F 18 GALLIUM 296-5846 MBQ/ML IV SOLN
8.6400 | Freq: Once | INTRAVENOUS | Status: AC
  Administered 2023-10-01: 8.64 via INTRAVENOUS
  Filled 2023-10-01: qty 9

## 2023-10-07 ENCOUNTER — Encounter: Payer: Self-pay | Admitting: Urology

## 2023-10-08 ENCOUNTER — Telehealth: Payer: Self-pay | Admitting: Urology

## 2023-10-08 DIAGNOSIS — C61 Malignant neoplasm of prostate: Secondary | ICD-10-CM

## 2023-10-08 NOTE — Telephone Encounter (Signed)
 I contacted Stuart Mosley to discuss his PSMA/PET results.  There was intense activity in the right pelvis with a mass measuring 3.1 x 1.9 cm.  There was moderate right hydronephrosis/hydroureter to the region of this mass.  No adenopathy, visceral or skeletal metastasis.  Although the PET report states the right seminal vesicle is enlarged he is status post radical prostatectomy and the seminal vesicles were removed.  He has had both radiation and surgery and I recommended a medical oncology appointment to discuss management options.  He was in agreement and referral was placed.

## 2023-10-12 ENCOUNTER — Telehealth: Payer: Self-pay | Admitting: Pharmacist

## 2023-10-12 ENCOUNTER — Telehealth: Payer: Self-pay | Admitting: Internal Medicine

## 2023-10-12 ENCOUNTER — Inpatient Hospital Stay

## 2023-10-12 ENCOUNTER — Other Ambulatory Visit (HOSPITAL_COMMUNITY): Payer: Self-pay

## 2023-10-12 ENCOUNTER — Telehealth: Payer: Self-pay

## 2023-10-12 ENCOUNTER — Inpatient Hospital Stay: Attending: Internal Medicine | Admitting: Internal Medicine

## 2023-10-12 ENCOUNTER — Encounter: Payer: Self-pay | Admitting: Internal Medicine

## 2023-10-12 VITALS — BP 131/80 | HR 80 | Temp 98.2°F | Resp 16 | Ht 69.0 in | Wt 297.6 lb

## 2023-10-12 DIAGNOSIS — C61 Malignant neoplasm of prostate: Secondary | ICD-10-CM

## 2023-10-12 DIAGNOSIS — E1122 Type 2 diabetes mellitus with diabetic chronic kidney disease: Secondary | ICD-10-CM | POA: Diagnosis not present

## 2023-10-12 DIAGNOSIS — I129 Hypertensive chronic kidney disease with stage 1 through stage 4 chronic kidney disease, or unspecified chronic kidney disease: Secondary | ICD-10-CM | POA: Diagnosis not present

## 2023-10-12 DIAGNOSIS — N1831 Chronic kidney disease, stage 3a: Secondary | ICD-10-CM | POA: Insufficient documentation

## 2023-10-12 DIAGNOSIS — Z79899 Other long term (current) drug therapy: Secondary | ICD-10-CM | POA: Insufficient documentation

## 2023-10-12 DIAGNOSIS — Z923 Personal history of irradiation: Secondary | ICD-10-CM | POA: Diagnosis not present

## 2023-10-12 DIAGNOSIS — Z794 Long term (current) use of insulin: Secondary | ICD-10-CM | POA: Insufficient documentation

## 2023-10-12 LAB — CBC WITH DIFFERENTIAL/PLATELET
Abs Immature Granulocytes: 0.08 K/uL — ABNORMAL HIGH (ref 0.00–0.07)
Basophils Absolute: 0 K/uL (ref 0.0–0.1)
Basophils Relative: 0 %
Eosinophils Absolute: 0.2 K/uL (ref 0.0–0.5)
Eosinophils Relative: 2 %
HCT: 40.1 % (ref 39.0–52.0)
Hemoglobin: 13.5 g/dL (ref 13.0–17.0)
Immature Granulocytes: 1 %
Lymphocytes Relative: 29 %
Lymphs Abs: 2.5 K/uL (ref 0.7–4.0)
MCH: 33.2 pg (ref 26.0–34.0)
MCHC: 33.7 g/dL (ref 30.0–36.0)
MCV: 98.5 fL (ref 80.0–100.0)
Monocytes Absolute: 0.9 K/uL (ref 0.1–1.0)
Monocytes Relative: 10 %
Neutro Abs: 4.8 K/uL (ref 1.7–7.7)
Neutrophils Relative %: 58 %
Platelets: 322 K/uL (ref 150–400)
RBC: 4.07 MIL/uL — ABNORMAL LOW (ref 4.22–5.81)
RDW: 14.3 % (ref 11.5–15.5)
WBC: 8.5 K/uL (ref 4.0–10.5)
nRBC: 0 % (ref 0.0–0.2)

## 2023-10-12 LAB — COMPREHENSIVE METABOLIC PANEL WITH GFR
ALT: 29 U/L (ref 0–44)
AST: 27 U/L (ref 15–41)
Albumin: 3.9 g/dL (ref 3.5–5.0)
Alkaline Phosphatase: 42 U/L (ref 38–126)
Anion gap: 8 (ref 5–15)
BUN: 25 mg/dL — ABNORMAL HIGH (ref 8–23)
CO2: 20 mmol/L — ABNORMAL LOW (ref 22–32)
Calcium: 9 mg/dL (ref 8.9–10.3)
Chloride: 109 mmol/L (ref 98–111)
Creatinine, Ser: 1.72 mg/dL — ABNORMAL HIGH (ref 0.61–1.24)
GFR, Estimated: 42 mL/min — ABNORMAL LOW (ref >60)
Glucose, Bld: 102 mg/dL — ABNORMAL HIGH (ref 70–99)
Potassium: 4.4 mmol/L (ref 3.5–5.1)
Sodium: 137 mmol/L (ref 135–145)
Total Bilirubin: 0.6 mg/dL (ref 0.0–1.2)
Total Protein: 6.8 g/dL (ref 6.5–8.1)

## 2023-10-12 LAB — PSA: Prostatic Specific Antigen: 4.17 ng/mL — ABNORMAL HIGH (ref 0.00–4.00)

## 2023-10-12 NOTE — Progress Notes (Signed)
 Has been getting tx at [facility], changed providers due to insurance.  No questions at this time.

## 2023-10-12 NOTE — Assessment & Plan Note (Addendum)
#   2015-prostate cancer s/p prostatectomy [high risk]-status post radiation-followed by ADT 6 months.   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    #  I reviewed the natural history/stage of patient's prostate cancer in detail.  Discussed that unfortunately patient's cancer cannot be cured.  All treatments are palliative and not curative.  However given the patient's long history of prostate cancer/metachronous/low-volume disease-expectancy should be in the years.  Will also review at the tumor conference.  #I had a long discussion regarding the concerning findings progressive disease.  Again discussed at length the importance of starting ADT-to treat his progressive prostate cancer.  I reviewed the mechanism of action of ADT/blocking testosterone.  Again reviewed the potential side effects including but not limited to-fatigue hot flashes loss of libido.   I would recommend eligard.  Understands treatments are palliative not curative. Discussed treatments are in general indefinite.   # Also discussed regarding use of novel androgen receptor blocker oral therapies-in low-volume metastatic castrate sensitive prostate cancer.  I discussed the options of oral therapies include based on drug interaction/insurance approval/co-pay assistance.  Discussed with pharmacy.  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 [ Biopsy proven- Dr.Kolluru]- stable.   Thank you Dr. Twylla for allowing me to participate in the care of your pleasant patient. Please do not hesitate to contact me with questions or concerns in the interim.  #  DISPOSITION: # labs today- # Eligard next week-  # follow up in appx 5 weeks- MD; labs- cbc/cmp; PSA- Dr.B  # I reviewed the blood work- with the patient in detail; also reviewed the imaging independently [as summarized above]; and with the patient in detail.

## 2023-10-12 NOTE — Telephone Encounter (Signed)
 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  CMP from 10/12/23 assessed, SCr 1.72, CrCl ~29ml/min. Monitor renal function, if CrCl worsens to severe (<54ml/min) dose would need to be reduced to 300mg  twice daily. Prescription dose and frequency assessed.   Current medication list in Epic reviewed, one DDIs with darolutamide  identified: Rosuvastatin: Darolutamide  may increase serum concentrations of Rosuvastatin. It is recommended to limit the dose of rosuvastatin to 5 mg daily when combined with darolutamide .  Rosuvastatin prescribed by Dr. Kiersten Mullis from [facility]   Evaluated chart and no patient barriers to medication adherence identified.   Prescription has been e-scribed to the [facility] for benefits analysis and approval.  Oral Oncology Clinic will continue to follow for insurance authorization, copayment issues, initial counseling and start date.   Genette Huertas N. Zofia Peckinpaugh, PharmD, BCOP, CPP Hematology/Oncology Clinical Pharmacist [facility] Oral Chemotherapy Navigation [facility] [phone]  10/12/2023 4:38 PM

## 2023-10-12 NOTE — Progress Notes (Signed)
 [facility] OFFICE PROGRESS NOTE  Patient Care Team: System, Provider Not In as PCP - General Rennie Cindy SAUNDERS, MD as Consulting Physician (Oncology)   Cancer Staging  Prostate cancer [facility]) Staging form: Prostate, AJCC 8th Edition - Clinical: Stage IVA (cTX, cN1, cM0) - Signed by Rennie Cindy SAUNDERS, MD on 10/12/2023    Oncology History Overview Note  Status post RALP August 2015 for Gleason 4+4 had no carcinoma. Surgery performed at [facility] by Dr. Julieann. Final pathology showed a positive margin at the posterior bladder neck and right lateral prostate; seminal vesicles were free of tumor. Pre-op PSA was 16.3. Initial post-op PSA was 0. Underwent IMRT to pelvis completed 06/06/2014. ADT initiated 03/2014. PSA undetectable after completion of radiation initially. On record review, his PSA was 3.0 in August 2024, 2.9 in December 2024, 3.5 in March 2025, and 3.9 in June 2025. He has no bony pain or pelvic pain. He has been incontinent since his prostatectomy, which has not worsened. 1. Intense radiotracer activity within the RIGHT seminal vesicle most consistent with local prostate cancer recurrence. 2. No evidence of metastatic adenopathy in the pelvis or periaortic retroperitoneum. 3. No evidence of visceral metastasis or skeletal metastasis. 4. RIGHT hydronephrosis and hydroureter. Presumed distal ureteral obstruction related to tumor recurrence at the RIGHT seminal vesicle   Prostate cancer (HCC)  10/12/2023 Initial Diagnosis   Prostate cancer (HCC)   10/12/2023 Cancer Staging   Staging form: Prostate, AJCC 8th Edition - Clinical: Stage IVA (cTX, cN1, cM0) - Signed by Rennie Cindy SAUNDERS, MD on 10/12/2023      HISTORY OF PRESENT ILLNESS: Patient ambulating-independently/. Alone/Accompanied by wife.Stuart Mosley 68 y.o.  male pleasant patient history of diabetes hypertension chronic kidney disease stage III and also above history of prostate  cancer has been referred to us  by urology  to discuss further evaluation/treatment.  Patient's prostate cancer history reviewed at length/summarized as above.  Patient admits to chronic incontinence not any worse.  Denies any pain.  Denies any nausea vomiting.  Denies any tingling or numbness.   Review of Systems  Constitutional:  Negative for chills, diaphoresis, fever, malaise/fatigue and weight loss.  HENT:  Negative for nosebleeds and sore throat.   Eyes:  Negative for double vision.  Respiratory:  Negative for cough, hemoptysis, sputum production, shortness of breath and wheezing.   Cardiovascular:  Negative for chest pain, palpitations, orthopnea and leg swelling.  Gastrointestinal:  Negative for abdominal pain, blood in stool, constipation, diarrhea, heartburn, melena, nausea and vomiting.  Genitourinary:  Negative for dysuria, frequency and urgency.  Musculoskeletal:  Negative for back pain and joint pain.  Skin: Negative.  Negative for itching and rash.  Neurological:  Negative for dizziness, tingling, focal weakness, weakness and headaches.  Endo/Heme/Allergies:  Does not bruise/bleed easily.  Psychiatric/Behavioral:  Negative for depression. The patient is not nervous/anxious and does not have insomnia.       PAST MEDICAL HISTORY :  Past Medical History:  Diagnosis Date   Anxiety    Chronic kidney disease    Depression    Diabetes mellitus without complication (HCC)    History of kidney stones    Hypertension     PAST SURGICAL HISTORY :   Past Surgical History:  Procedure Laterality Date   HERNIA REPAIR     JOINT REPLACEMENT     left hip   PROSTATECTOMY      FAMILY HISTORY :   Family History  Problem Relation Age  of Onset   Diabetes Mother    Diabetes Father        3 sisters living   Healthy Sister    Prostate cancer Brother 49    SOCIAL HISTORY:   Social History   Tobacco Use   Smoking status: Never   Smokeless tobacco: Never  Vaping Use    Vaping status: Never Used  Substance Use Topics   Alcohol use: Not Currently   Drug use: Never    ALLERGIES:  is allergic to penicillins and tetracyclines & related.  MEDICATIONS:  Current Outpatient Medications  Medication Sig Dispense Refill   aspirin EC 81 MG tablet TAKE 1 TABLET BY MOUTH ONCE DAILY.; Duration: 90     Blood Glucose Monitoring Suppl (TEMPO WELCOME) w/Device KIT Use as directed in injet insulin . 1 kit 0   Cholecalciferol 50 MCG (2000 UT) CAPS TAKE 1 CAPSULE BY MOUTH ONCE DAILY.; Duration: 90     fenofibrate (TRICOR) 145 MG tablet 1 tablet Orally Once a day; Duration: 90 days     fluticasone (FLONASE) 50 MCG/ACT nasal spray Place into both nostrils daily.     folic acid (FOLVITE) 1 MG tablet Take 1 mg by mouth daily.     glucose blood (ONETOUCH VERIO) test strip Use to test blood sugar 3 times a day 300 strip 1   glucose blood (ONETOUCH VERIO) test strip use as directed four times daily and as needed 500 each 1   icosapent  Ethyl (VASCEPA ) 1 g capsule Take 1 capsule (1 g total) by mouth 2 (two) times daily. (Patient taking differently: Take 2 g by mouth 2 (two) times daily.) 60 capsule 3   insulin  glargine (LANTUS) 100 UNIT/ML injection Inject 41 Units into the skin daily.     Insulin  Lispro w/ Trans Port (HUMALOG  TEMPO PEN) 100 UNIT/ML SOPN Inject 3 times a day with meals if bs is >150 add 2 units. If >200 add 4 units. If >250 add 6 units If >300 add 6 u (max 540 units a month) 15 mL 5   Insulin  Pen Needle (ULTICARE MINI PEN NEEDLES) 31G X 6 MM MISC See admin instructions.     JARDIANCE 25 MG TABS tablet Take 25 mg by mouth daily.     lisinopril (ZESTRIL) 10 MG tablet Take 10 mg by mouth daily.     metFORMIN (GLUMETZA) 1000 MG (MOD) 24 hr tablet Take 1,000 mg by mouth 2 (two) times daily with a meal.     rosuvastatin (CRESTOR) 40 MG tablet Take 40 mg by mouth daily.     SLOW-MAG 71.5-119 MG TBEC SR tablet TAKE 1 TABLET BY MOUTH TWICE DAILY.; Duration: 90 days (Patient  taking differently: 1 tablet 2 (two) times daily.)     TRULICITY 0.75 MG/0.5ML SOAJ INJECT 0.75MG  SUBCUTANEOUSLY WEEKLY; Duration: 30     No current facility-administered medications for this visit.    PHYSICAL EXAMINATION:   BP 131/80 (BP Location: Left Arm, Patient Position: Sitting, Cuff Size: Large)   Pulse 80   Temp 98.2 F (36.8 C) (Tympanic)   Resp 16   Ht 5' 9 (1.753 m)   Wt 297 lb 9.6 oz (135 kg)   SpO2 96%   BMI 43.95 kg/m   Filed Weights   10/12/23 1357  Weight: 297 lb 9.6 oz (135 kg)    Physical Exam Vitals and nursing note reviewed.  HENT:     Head: Normocephalic and atraumatic.     Mouth/Throat:     Pharynx: Oropharynx is  clear.  Eyes:     Extraocular Movements: Extraocular movements intact.     Pupils: Pupils are equal, round, and reactive to light.  Cardiovascular:     Rate and Rhythm: Normal rate and regular rhythm.  Pulmonary:     Comments: Decreased breath sounds bilaterally.  Abdominal:     Palpations: Abdomen is soft.  Musculoskeletal:        General: Normal range of motion.     Cervical back: Normal range of motion.  Skin:    General: Skin is warm.  Neurological:     General: No focal deficit present.     Mental Status: He is alert and oriented to person, place, and time.  Psychiatric:        Behavior: Behavior normal.        Judgment: Judgment normal.      LABORATORY DATA:  I have reviewed the data as listed    Component Value Date/Time   NA 137 10/12/2023 1448   K 4.4 10/12/2023 1448   CL 109 10/12/2023 1448   CO2 20 (L) 10/12/2023 1448   GLUCOSE 102 (H) 10/12/2023 1448   BUN 25 (H) 10/12/2023 1448   CREATININE 1.72 (H) 10/12/2023 1448   CALCIUM 9.0 10/12/2023 1448   PROT 6.8 10/12/2023 1448   ALBUMIN 3.9 10/12/2023 1448   AST 27 10/12/2023 1448   ALT 29 10/12/2023 1448   ALKPHOS 42 10/12/2023 1448   BILITOT 0.6 10/12/2023 1448   GFRNONAA 42 (L) 10/12/2023 1448    No results found for: SPEP, UPEP  Lab Results   Component Value Date   WBC 8.5 10/12/2023   NEUTROABS 4.8 10/12/2023   HGB 13.5 10/12/2023   HCT 40.1 10/12/2023   MCV 98.5 10/12/2023   PLT 322 10/12/2023      Chemistry      Component Value Date/Time   NA 137 10/12/2023 1448   K 4.4 10/12/2023 1448   CL 109 10/12/2023 1448   CO2 20 (L) 10/12/2023 1448   BUN 25 (H) 10/12/2023 1448   CREATININE 1.72 (H) 10/12/2023 1448      Component Value Date/Time   CALCIUM 9.0 10/12/2023 1448   ALKPHOS 42 10/12/2023 1448   AST 27 10/12/2023 1448   ALT 29 10/12/2023 1448   BILITOT 0.6 10/12/2023 1448       RADIOGRAPHIC STUDIES: I have personally reviewed the radiological images as listed and agreed with the findings in the report. No results found.   ASSESSMENT & PLAN:  Prostate cancer [facility]) # 2015-prostate cancer s/p prostatectomy [high risk]-status post radiation-followed by ADT 6 months.   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    #  I reviewed the natural history/stage of patient's prostate cancer in detail.  Discussed that unfortunately patient's cancer cannot be cured.  All treatments are palliative and not curative.  However given the patient's long history of prostate cancer/metachronous/low-volume disease-   #I had a long discussion regarding the concerning findings progressive disease.  Again discussed at length the importance of starting ADT-to treat his progressive prostate cancer.  I reviewed the mechanism of action of ADT/blocking testosterone.  Again reviewed the potential side effects including but not limited to-fatigue hot flashes loss of libido.   I would recommend eligard.  Understands treatments are palliative not curative. Discussed treatments  are in general indefinite.   # Also discussed regarding  use of novel androgen receptor blocker oral therapies-in low-volume metastatic castrate sensitive prostate cancer.  I discussed the options of oral therapies include based on drug interaction/insurance approval/co-pay assistance.  Discussed with pharmacy.  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 [ Biopsy proven- Dr.Kolluru]- stable.   Thank you Dr. Twylla for allowing me to participate in the care of your pleasant patient. Please do not hesitate to contact me with questions or concerns in the interim.  # DISPOSITION: # labs today- # Eligard next week-  # follow up in appx 5 weeks- MD; labs- cbc/cmp; PSA- Dr.B   Orders Placed This Encounter  Procedures   CBC with Differential/Platelet    Standing Status:   Future    Number of Occurrences:   1    Expected Date:   10/12/2023    Expiration Date:   10/11/2024   Comprehensive metabolic panel with GFR    Standing Status:   Future    Number of Occurrences:   1    Expected Date:   10/12/2023    Expiration Date:   10/11/2024   PSA    Standing Status:   Future    Number of Occurrences:   1    Expected Date:   10/12/2023    Expiration Date:   10/11/2024   CBC with Differential (Cancer Center Only)    Standing Status:   Future    Expected Date:   11/16/2023    Expiration Date:   02/14/2024   CMP (Cancer Center only)    Standing Status:   Future    Expected Date:   11/16/2023    Expiration Date:   02/14/2024   PSA    Standing Status:   Future    Expected Date:   11/16/2023    Expiration Date:   02/14/2024   All questions were answered. The patient knows to call the clinic with any problems, questions or concerns.      Cindy JONELLE Joe, MD 10/12/2023 4:23 PM

## 2023-10-12 NOTE — Telephone Encounter (Signed)
 Pt asking why surgery was not discussed or is it not an option for him?

## 2023-10-12 NOTE — Telephone Encounter (Signed)
 Discussed with Dr.Stoioff- no obvious role for surgery at this time. However, will await further recommendations from Urology.   Discussed with pt. Keep appts as planned. GB

## 2023-10-13 ENCOUNTER — Telehealth: Payer: Self-pay | Admitting: Pharmacy Technician

## 2023-10-13 ENCOUNTER — Encounter: Payer: Self-pay | Admitting: Internal Medicine

## 2023-10-13 ENCOUNTER — Other Ambulatory Visit: Payer: Self-pay | Admitting: Pharmacy Technician

## 2023-10-13 ENCOUNTER — Other Ambulatory Visit: Payer: Self-pay

## 2023-10-13 ENCOUNTER — Other Ambulatory Visit (HOSPITAL_COMMUNITY): Payer: Self-pay

## 2023-10-13 MED ORDER — NUBEQA 300 MG PO TABS
600.0000 mg | ORAL_TABLET | Freq: Two times a day (BID) | ORAL | 2 refills | Status: DC
  Filled 2023-10-13: qty 120, 30d supply, fill #0
  Filled 2023-11-03: qty 120, 30d supply, fill #1
  Filled 2023-12-07: qty 120, 30d supply, fill #2

## 2023-10-13 NOTE — Telephone Encounter (Signed)
 Oral Oncology Patient Advocate Encounter  Prior Authorization for Nubeqa  has been approved.    PA# [id number]  Effective dates: 10/13/2023 through 10/12/2024  Patients co-pay is $0.    Crowheart (Patty) Chet Burnet, CPhT  [facility], [place], [place] Oral Chemotherapy Patient Advocate Specialist III Phone: [phone]  Fax: [phone]

## 2023-10-13 NOTE — Telephone Encounter (Signed)
 Oral Oncology Patient Advocate Encounter   Was successful in securing patient a $7,500 grant from [employer] to provide copayment coverage for Nubeqa .  This will keep the out of pocket expense at $0.    The billing information is as follows and has been shared with [facility].   Member ID: [id number] Group ID: [facility] RxBin: [id number] PCN: [id number] Dates of Eligibility: 10/12/2023 through 10/11/2024  Fund name: Metastatic Prostate Cancer.   Hymie Gorr (Patty) Chet Burnet, CPhT  [facility], [place], [place] Oral Chemotherapy Patient Advocate Specialist III Phone: [phone]  Fax: [phone]

## 2023-10-13 NOTE — Telephone Encounter (Signed)
 Left message with Dr. Mariano Lindau office to discuss darolutamide -rosuvastatin drug interaction.

## 2023-10-13 NOTE — Telephone Encounter (Signed)
 Received return call from Dr. Halbert RN. Dr. Halbert is okay with the dose reduction of rosuvastatin to 5mg  daily. They will send in the new prescription for the patient.

## 2023-10-13 NOTE — Progress Notes (Signed)
 Specialty Pharmacy Initial Fill Coordination Note  Stuart Mosley is a 68 y.o. male contacted today regarding refills of specialty medication(s) Darolutamide  (Nubeqa ) .  Patient requested Delivery  on 10/15/23  to verified address [address]   [facility] [place] [address] (If possible please call patient when medication is out for delivery at [phone])   Medication will be filled on 10/14/2023.   Patient is aware of $0 copayment.   Khaden Gater (Patty) Chet Burnet, CPhT  [facility], [place], [place] Oral Chemotherapy Patient Advocate Specialist III Phone: [phone]  Fax: [phone]

## 2023-10-13 NOTE — Progress Notes (Signed)
 Patient education documented in EPIC note on 10/13/23.

## 2023-10-13 NOTE — Telephone Encounter (Signed)
 Oral Oncology Patient Advocate Encounter   New authorization   Received notification that prior authorization for Nubeqa  is required.   PA submitted on CMM via [employer] [id number]  Status is pending     Lisabeth Mian (Patty) Chet Burnet, CPhT  [facility], [place], [place] Oral Chemotherapy Patient Advocate Specialist III Phone: [phone]  Fax: [phone]

## 2023-10-13 NOTE — Telephone Encounter (Signed)
 Patient successfully OnBoarded and drug education provided by pharmacist. Medication scheduled to be shipped on 08/28 for delivery on 08/29 from [facility] to patient's address. Patient also knows to call me at [phone] with any questions or concerns regarding receiving medication or if there is any unexpected change in co-pay.   Stuart Mosley (Patty) Chet Burnet, CPhT  [facility], [place], [place] Oral Chemotherapy Patient Advocate Specialist III Phone: [phone]  Fax: [phone]

## 2023-10-13 NOTE — Telephone Encounter (Signed)
 Clinical Pharmacist Practitioner Encounter   [facility] (Specialty) will deliver medication to patient on 10/15/23. He will get started once he has medication in hand.    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   Treatment goal: Palliative  Counseled patient on administration, dosing, side effects, monitoring, drug-food interactions, safe handling, storage, and disposal. Patient will take 2 tablets (600 mg total) by mouth 2 (two) times daily with a meal.   Reviewed with patient the darolutamide -rosuvastatin drug interaction. He knows that his family medicine provider Dr. Halbert will be sending in a new prescription for rosuvastatin 5mg  daily. He should stop his rosuvastatin. He has taken his rosuvastatin 40mg  for today, he knows to now stop it. He will pick up the new 5mg  dose from his local pharmacy.   Side effects include but not limited to: decreased wbc and changes in liver function.    Reviewed with patient importance of keeping a medication schedule and plan for any missed doses.  After discussion with patient no patient barriers to medication adherence identified.   Distress evaluation: Distress thermometer completed during telephone call and reviewed with patient. Due to score, social work referral has not been sent.  Communication and Learning Assessment Primary learner: patient Barriers to learning: No barriers Preferred language: English Learning preferences: Listening Reading   Mr. Kipper voiced understanding and appreciation. All questions answered. Medication handout provided.  Provided patient with Oral Chemotherapy Navigation Clinic phone number. Patient knows to call the office with questions or concerns. Oral  Chemotherapy Navigation Clinic will continue to follow.  Lorriane Dehart N. Khye Hochstetler, PharmD, BCOP, CPP Hematology/Oncology Clinical Pharmacist [facility] Oral Chemotherapy Navigation Clinic [phone]  10/13/2023 2:46 PM

## 2023-10-19 ENCOUNTER — Telehealth: Payer: Self-pay | Admitting: Licensed Clinical Social Worker

## 2023-10-19 ENCOUNTER — Inpatient Hospital Stay: Attending: Internal Medicine

## 2023-10-19 DIAGNOSIS — Z5111 Encounter for antineoplastic chemotherapy: Secondary | ICD-10-CM | POA: Diagnosis present

## 2023-10-19 DIAGNOSIS — C61 Malignant neoplasm of prostate: Secondary | ICD-10-CM | POA: Diagnosis present

## 2023-10-19 MED ORDER — LEUPROLIDE ACETATE (6 MONTH) 45 MG ~~LOC~~ KIT
45.0000 mg | PACK | Freq: Once | SUBCUTANEOUS | Status: AC
  Administered 2023-10-19: 45 mg via SUBCUTANEOUS
  Filled 2023-10-19: qty 45

## 2023-10-19 NOTE — Telephone Encounter (Signed)
 Messaged Dr. Rennie ahead of tumor board 9/4 and recommended genetic testing for Mr. Shek based on his personal and family history of prostate cancer.

## 2023-10-20 ENCOUNTER — Other Ambulatory Visit: Payer: Self-pay

## 2023-10-21 ENCOUNTER — Other Ambulatory Visit

## 2023-10-21 ENCOUNTER — Other Ambulatory Visit: Payer: Self-pay

## 2023-11-03 ENCOUNTER — Inpatient Hospital Stay

## 2023-11-03 ENCOUNTER — Other Ambulatory Visit: Payer: Self-pay

## 2023-11-03 DIAGNOSIS — C61 Malignant neoplasm of prostate: Secondary | ICD-10-CM

## 2023-11-03 NOTE — Progress Notes (Signed)
 Multidisciplinary Oncology Council Documentation  Stuart Mosley was presented by our [facility] on 11/03/2023, which included representatives from:  Palliative Care Dietitian  Physical/Occupational Therapist Nurse Navigator Genetics Social work Survivorship RN Financial Navigator Research RN   Stuart Mosley currently presents with history of prostate cancer  We reviewed previous medical and familial history, history of present illness, and recent lab results along with all available histopathologic and imaging studies. The MOC considered available treatment options and made the following recommendations/referrals:  Social Work, Oncologist  The [employer] is a Radiation protection practitioner from various specialty areas who evaluate and discuss patients for whom a multidisciplinary approach is being considered. Final determinations in the plan of care are those of the provider(s).   Today's extended care, comprehensive team conference, Stuart Mosley was not present for the discussion and was not examined.

## 2023-11-03 NOTE — Progress Notes (Signed)
 Specialty Pharmacy Ongoing Clinical Assessment Note  Stuart Mosley is a 68 y.o. male who is being followed by the specialty pharmacy service for RxSp Oncology   Patient's specialty medication(s) reviewed today: Darolutamide  (Nubeqa )   Missed doses in the last 4 weeks: 0   Patient/Caregiver did not have any additional questions or concerns.   Therapeutic benefit summary: Unable to assess (Recently initiated therapy)   Adverse events/side effects summary: No adverse events/side effects   Patient's therapy is appropriate to: Continue    Goals Addressed             This Visit's Progress    Slow Disease Progression       Patient is initiating therapy; follow-up scheduled 11/16/23. Patient will maintain adherence and be evaluated at upcoming provider appointment to assess progress         Follow up: 3 months  [name] [name] Gallus Specialty Pharmacist

## 2023-11-03 NOTE — Progress Notes (Signed)
 Specialty Pharmacy Refill Coordination Note  Stuart Mosley is a 68 y.o. male contacted today regarding refills of specialty medication(s) Darolutamide  (Nubeqa )   Patient requested Delivery   Delivery date: 11/11/23   Verified address: [address]   [facility] [place] [facility] [place] [address]   Medication will be filled on 11/10/23.

## 2023-11-05 ENCOUNTER — Other Ambulatory Visit (HOSPITAL_COMMUNITY): Payer: Self-pay

## 2023-11-09 ENCOUNTER — Inpatient Hospital Stay

## 2023-11-09 NOTE — Progress Notes (Signed)
 [facility] Clinical Social Work  Clinical Social Work was referred by [facility] for assessment of psychosocial needs.  Clinical Social Worker contacted patient by phone to offer support and assess for needs.     Interventions: Provided patient with information about CSW role.  Patient lives with his spouse and has three daughters that live locally.  He expressed concern about running out of Nubeqa .  Contacted Pharmacist for assistance.  Patient reports all of his other needs being met.  Per patient request, also confirmed his upcoming appointments.       Follow Up Plan:  CSW will follow-up with patient by phone     Macario [name] Au, LCSW  Clinical Social Worker [facility]

## 2023-11-10 ENCOUNTER — Other Ambulatory Visit: Payer: Self-pay

## 2023-11-16 ENCOUNTER — Encounter: Payer: Self-pay | Admitting: Internal Medicine

## 2023-11-16 ENCOUNTER — Inpatient Hospital Stay

## 2023-11-16 ENCOUNTER — Inpatient Hospital Stay: Admitting: Internal Medicine

## 2023-11-16 ENCOUNTER — Telehealth: Payer: Self-pay | Admitting: Internal Medicine

## 2023-11-16 ENCOUNTER — Ambulatory Visit: Payer: Self-pay | Admitting: Internal Medicine

## 2023-11-16 VITALS — BP 121/66 | HR 80 | Temp 98.6°F | Resp 17 | Wt 287.1 lb

## 2023-11-16 DIAGNOSIS — C61 Malignant neoplasm of prostate: Secondary | ICD-10-CM | POA: Diagnosis not present

## 2023-11-16 DIAGNOSIS — Z5111 Encounter for antineoplastic chemotherapy: Secondary | ICD-10-CM | POA: Diagnosis not present

## 2023-11-16 LAB — CBC WITH DIFFERENTIAL (CANCER CENTER ONLY)
Abs Immature Granulocytes: 0.04 K/uL (ref 0.00–0.07)
Basophils Absolute: 0 K/uL (ref 0.0–0.1)
Basophils Relative: 0 %
Eosinophils Absolute: 0.2 K/uL (ref 0.0–0.5)
Eosinophils Relative: 3 %
HCT: 39.4 % (ref 39.0–52.0)
Hemoglobin: 13.5 g/dL (ref 13.0–17.0)
Immature Granulocytes: 1 %
Lymphocytes Relative: 28 %
Lymphs Abs: 2.2 K/uL (ref 0.7–4.0)
MCH: 33.3 pg (ref 26.0–34.0)
MCHC: 34.3 g/dL (ref 30.0–36.0)
MCV: 97.3 fL (ref 80.0–100.0)
Monocytes Absolute: 0.7 K/uL (ref 0.1–1.0)
Monocytes Relative: 9 %
Neutro Abs: 4.6 K/uL (ref 1.7–7.7)
Neutrophils Relative %: 59 %
Platelet Count: 331 K/uL (ref 150–400)
RBC: 4.05 MIL/uL — ABNORMAL LOW (ref 4.22–5.81)
RDW: 13.8 % (ref 11.5–15.5)
WBC Count: 7.8 K/uL (ref 4.0–10.5)
nRBC: 0 % (ref 0.0–0.2)

## 2023-11-16 LAB — CMP (CANCER CENTER ONLY)
ALT: 25 U/L (ref 0–44)
AST: 30 U/L (ref 15–41)
Albumin: 3.9 g/dL (ref 3.5–5.0)
Alkaline Phosphatase: 44 U/L (ref 38–126)
Anion gap: 7 (ref 5–15)
BUN: 32 mg/dL — ABNORMAL HIGH (ref 8–23)
CO2: 20 mmol/L — ABNORMAL LOW (ref 22–32)
Calcium: 8.7 mg/dL — ABNORMAL LOW (ref 8.9–10.3)
Chloride: 111 mmol/L (ref 98–111)
Creatinine: 1.79 mg/dL — ABNORMAL HIGH (ref 0.61–1.24)
GFR, Estimated: 41 mL/min — ABNORMAL LOW (ref >60)
Glucose, Bld: 92 mg/dL (ref 70–99)
Potassium: 4.5 mmol/L (ref 3.5–5.1)
Sodium: 138 mmol/L (ref 135–145)
Total Bilirubin: 0.9 mg/dL (ref 0.0–1.2)
Total Protein: 6.9 g/dL (ref 6.5–8.1)

## 2023-11-16 LAB — PSA: Prostatic Specific Antigen: <0.02 ng/mL (ref 0.00–4.00)

## 2023-11-16 NOTE — Progress Notes (Signed)
 Patient has no concerns

## 2023-11-16 NOTE — Progress Notes (Signed)
 [facility] OFFICE PROGRESS NOTE  Patient Care Team: System, Provider Not In as PCP - General Rennie Cindy SAUNDERS, MD as Consulting Physician (Oncology)   Cancer Staging  Prostate cancer [facility]) Staging form: Prostate, AJCC 8th Edition - Clinical: Stage IVA (cTX, cN1, cM0) - Signed by Rennie Cindy SAUNDERS, MD on 10/12/2023    Oncology History Overview Note  Status post RALP August 2015 for Gleason 4+4 had no carcinoma. Surgery performed at [facility] by Dr. Julieann. Final pathology showed a positive margin at the posterior bladder neck and right lateral prostate; seminal vesicles were free of tumor. Pre-op PSA was 16.3. Initial post-op PSA was 0. Underwent IMRT to pelvis completed 06/06/2014. ADT initiated 03/2014. PSA undetectable after completion of radiation initially. On record review, his PSA was 3.0 in August 2024, 2.9 in December 2024, 3.5 in March 2025, and 3.9 in June 2025. He has no bony pain or pelvic pain. He has been incontinent since his prostatectomy, which has not worsened. 1. Intense radiotracer activity within the RIGHT seminal vesicle most consistent with local prostate cancer recurrence. 2. No evidence of metastatic adenopathy in the pelvis or periaortic retroperitoneum. 3. No evidence of visceral metastasis or skeletal metastasis. 4. RIGHT hydronephrosis and hydroureter. Presumed distal ureteral obstruction related to tumor recurrence at the RIGHT seminal Vesicle  # # 2015-prostate cancer s/p prostatectomy [high risk]-status post radiation-followed by ADT 6 months.   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   Prostate cancer (HCC)  10/12/2023 Initial Diagnosis   Prostate cancer (HCC)   10/12/2023 Cancer Staging   Staging form:  Prostate, AJCC 8th Edition - Clinical: Stage IVA (cTX, cN1, cM0) - Signed by Rennie Cindy SAUNDERS, MD on 10/12/2023      HISTORY OF PRESENT ILLNESS: Patient ambulating-independently. Accompanied by wife.   Stuart Mosley 68 y.o.  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   Patient admits to chronic incontinence not any worse.  Denies any pain.  Denies any nausea vomiting.  Denies any tingling or numbness.  Review of Systems  Constitutional:  Negative for chills, diaphoresis, fever, malaise/fatigue and weight loss.  HENT:  Negative for nosebleeds and sore throat.   Eyes:  Negative for double vision.  Respiratory:  Negative for cough, hemoptysis, sputum production, shortness of breath and wheezing.   Cardiovascular:  Negative for chest pain, palpitations, orthopnea and leg swelling.  Gastrointestinal:  Negative for abdominal pain, blood in stool, constipation, diarrhea, heartburn, melena, nausea and vomiting.  Genitourinary:  Negative for dysuria, frequency and urgency.  Musculoskeletal:  Negative for back pain and joint pain.  Skin: Negative.  Negative for itching and rash.  Neurological:  Negative for dizziness, tingling, focal weakness, weakness and headaches.  Endo/Heme/Allergies:  Does not bruise/bleed easily.  Psychiatric/Behavioral:  Negative for depression. The patient is not nervous/anxious and does not have insomnia.       PAST MEDICAL HISTORY :  Past Medical History:  Diagnosis Date   Anxiety    Chronic kidney disease    Depression    Diabetes mellitus without complication (HCC)    History of kidney stones    Hypertension     PAST SURGICAL HISTORY :  Past Surgical History:  Procedure Laterality Date   HERNIA REPAIR     JOINT REPLACEMENT     left hip   PROSTATECTOMY      FAMILY HISTORY :   Family History   Problem Relation Age of Onset   Diabetes Mother    Diabetes Father        3 sisters living   Healthy Sister    Prostate cancer Brother 67    SOCIAL HISTORY:   Social History   Tobacco Use   Smoking status: Never   Smokeless tobacco: Never  Vaping Use   Vaping status: Never Used  Substance Use Topics   Alcohol use: Not Currently   Drug use: Never    ALLERGIES:  is allergic to penicillins and tetracyclines & related.  MEDICATIONS:  Current Outpatient Medications  Medication Sig Dispense Refill   aspirin EC 81 MG tablet TAKE 1 TABLET BY MOUTH ONCE DAILY.; Duration: 90     Blood Glucose Monitoring Suppl (TEMPO WELCOME) w/Device KIT Use as directed in injet insulin . 1 kit 0   Cholecalciferol 50 MCG (2000 UT) CAPS TAKE 1 CAPSULE BY MOUTH ONCE DAILY.; Duration: 90     darolutamide  (NUBEQA ) 300 MG tablet Take 2 tablets (600 mg total) by mouth 2 (two) times daily with a meal. 120 tablet 2   fenofibrate (TRICOR) 145 MG tablet 1 tablet Orally Once a day; Duration: 90 days     fluticasone (FLONASE) 50 MCG/ACT nasal spray Place into both nostrils daily.     folic acid (FOLVITE) 1 MG tablet Take 1 mg by mouth daily.     glucose blood (ONETOUCH VERIO) test strip Use to test blood sugar 3 times a day 300 strip 1   glucose blood (ONETOUCH VERIO) test strip use as directed four times daily and as needed 500 each 1   icosapent  Ethyl (VASCEPA ) 1 g capsule Take 1 capsule (1 g total) by mouth 2 (two) times daily. (Patient taking differently: Take 2 g by mouth 2 (two) times daily.) 60 capsule 3   insulin  glargine (LANTUS) 100 UNIT/ML injection Inject 41 Units into the skin daily.     Insulin  Lispro w/ Trans Port (HUMALOG  TEMPO PEN) 100 UNIT/ML SOPN Inject 3 times a day with meals if bs is >150 add 2 units. If >200 add 4 units. If >250 add 6 units If >300 add 6 u (max 540 units a month) 15 mL 5   Insulin  Pen Needle (ULTICARE MINI PEN NEEDLES) 31G X 6 MM MISC See admin instructions.     JARDIANCE 25  MG TABS tablet Take 25 mg by mouth daily.     lisinopril (ZESTRIL) 10 MG tablet Take 10 mg by mouth daily.     metFORMIN (GLUMETZA) 1000 MG (MOD) 24 hr tablet Take 1,000 mg by mouth 2 (two) times daily with a meal.     rosuvastatin (CRESTOR) 5 MG tablet Take 5 mg by mouth daily.     SLOW-MAG 71.5-119 MG TBEC SR tablet TAKE 1 TABLET BY MOUTH TWICE DAILY.; Duration: 90 days (Patient taking differently: 1 tablet 2 (two) times daily.)     TRULICITY 0.75 MG/0.5ML SOAJ INJECT 0.75MG  SUBCUTANEOUSLY WEEKLY; Duration: 30     No current facility-administered medications for this visit.    PHYSICAL EXAMINATION:   BP 121/66   Pulse 80   Temp 98.6 F (37 C)   Resp 17   Wt 287 lb 1.6 oz (130.2 kg)   SpO2 100%   BMI 42.40  kg/m   Filed Weights   11/16/23 1336  Weight: 287 lb 1.6 oz (130.2 kg)    Physical Exam Vitals and nursing note reviewed.  HENT:     Head: Normocephalic and atraumatic.     Mouth/Throat:     Pharynx: Oropharynx is clear.  Eyes:     Extraocular Movements: Extraocular movements intact.     Pupils: Pupils are equal, round, and reactive to light.  Cardiovascular:     Rate and Rhythm: Normal rate and regular rhythm.  Pulmonary:     Comments: Decreased breath sounds bilaterally.  Abdominal:     Palpations: Abdomen is soft.  Musculoskeletal:        General: Normal range of motion.     Cervical back: Normal range of motion.  Skin:    General: Skin is warm.  Neurological:     General: No focal deficit present.     Mental Status: He is alert and oriented to person, place, and time.  Psychiatric:        Behavior: Behavior normal.        Judgment: Judgment normal.      LABORATORY DATA:  I have reviewed the data as listed    Component Value Date/Time   NA 138 11/16/2023 1320   K 4.5 11/16/2023 1320   CL 111 11/16/2023 1320   CO2 20 (L) 11/16/2023 1320   GLUCOSE 92 11/16/2023 1320   BUN 32 (H) 11/16/2023 1320   CREATININE 1.79 (H) 11/16/2023 1320   CALCIUM  8.7 (L) 11/16/2023 1320   PROT 6.9 11/16/2023 1320   ALBUMIN 3.9 11/16/2023 1320   AST 30 11/16/2023 1320   ALT 25 11/16/2023 1320   ALKPHOS 44 11/16/2023 1320   BILITOT 0.9 11/16/2023 1320   GFRNONAA 41 (L) 11/16/2023 1320    No results found for: SPEP, UPEP  Lab Results  Component Value Date   WBC 7.8 11/16/2023   NEUTROABS 4.6 11/16/2023   HGB 13.5 11/16/2023   HCT 39.4 11/16/2023   MCV 97.3 11/16/2023   PLT 331 11/16/2023      Chemistry      Component Value Date/Time   NA 138 11/16/2023 1320   K 4.5 11/16/2023 1320   CL 111 11/16/2023 1320   CO2 20 (L) 11/16/2023 1320   BUN 32 (H) 11/16/2023 1320   CREATININE 1.79 (H) 11/16/2023 1320      Component Value Date/Time   CALCIUM 8.7 (L) 11/16/2023 1320   ALKPHOS 44 11/16/2023 1320   AST 30 11/16/2023 1320   ALT 25 11/16/2023 1320   BILITOT 0.9 11/16/2023 1320       RADIOGRAPHIC STUDIES: I have personally reviewed the radiological images as listed and agreed with the findings in the report. No results found.   ASSESSMENT & PLAN:  Prostate cancer (HCC) # JUNE 2025 LOW  volume-LOW RISK- castrate sensitive prostate cancer/rising PSA.  Stage IV -[June 2025-3.5] AUG 2025-PSMA PET scan- recurrent-metastatic to the pelvis [seminal vesicles resected with surgery]; however no evidence of no bone mets or visceral metastasis.  Currently on Eligard + Darolutamide  [SEP 1st, 2025].  Tolerating well.  Await PSA from today.  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 [ Biopsy proven- Dr.Kolluru]- stable.   # Genetics: family hx of prostate cancer-  recommended genetic testing for Mr. Moncivais based on his personal and family history of prostate cancer.  Eligard - 9/03-2023 #  DISPOSITION: # genetic referral re: prostate cancer # follow up in 3  months/1st week-  JAN MD; labs- cbc/cmp; PSA; Vit D 25-OH- - Dr.B    Orders Placed This Encounter  Procedures   CBC with Differential (Cancer Center Only)    Standing Status:   Future    Expected Date:   02/22/2024    Expiration Date:   05/22/2024   CMP (Cancer Center only)    Standing Status:   Future    Expected Date:   02/22/2024    Expiration Date:   05/22/2024   PSA    Standing Status:   Future    Expected Date:   02/22/2024    Expiration Date:   05/22/2024   VITAMIN D 25 Hydroxy (Vit-D Deficiency, Fractures)    Standing Status:   Future    Expected Date:   02/22/2024    Expiration Date:   05/22/2024   Ambulatory referral to Genetics    Standing Status:   Future    Expected Date:   02/22/2024    Expiration Date:   05/22/2024    Referral Priority:   Routine    Referral Type:   Consultation    Referral Reason:   Specialty Services Required    Number of Visits Requested:   1   All questions were answered. The patient knows to call the clinic with any problems, questions or concerns.      Cindy JONELLE Joe, MD 11/16/2023 3:16 PM

## 2023-11-16 NOTE — Assessment & Plan Note (Addendum)
#   JUNE 2025 LOW  volume-LOW RISK- castrate sensitive prostate cancer/rising PSA.  Stage IV -[June 2025-3.5] AUG 2025-PSMA PET scan- recurrent-metastatic to the pelvis [seminal vesicles resected with surgery]; however no evidence of no bone mets or visceral metastasis.  Currently on Eligard + Darolutamide  [SEP 1st, 2025].  Tolerating well.  Await PSA from today.  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 [ Biopsy proven- Dr.Kolluru]- stable.   # Genetics: family hx of prostate cancer-  recommended genetic testing for Stuart Mosley based on his personal and family history of prostate cancer.  Eligard - 9/03-2023 # DISPOSITION: # genetic referral re: prostate cancer # follow up in 3  months/1st week- JAN MD; labs- cbc/cmp; PSA; Vit D 25-OH- - Dr.B

## 2023-11-16 NOTE — Telephone Encounter (Signed)
 Called pt to ask if he was interested in getting scheduled for Genetic Counseling and he said I dont think I need it So I said okay, so are you not wanting to get scheduled for genetic counseling? and he said no I do not

## 2023-12-03 ENCOUNTER — Other Ambulatory Visit: Payer: Self-pay

## 2023-12-07 ENCOUNTER — Other Ambulatory Visit: Payer: Self-pay

## 2023-12-07 NOTE — Progress Notes (Signed)
 Clinical Intervention Note  Clinical Intervention Notes: Patient reported starting Trulicity. No DDIs identified with Nubeqa    Clinical Intervention Outcomes: Prevention of an adverse drug event   Advertising account planner

## 2023-12-07 NOTE — Progress Notes (Signed)
 Specialty Pharmacy Refill Coordination Note  Stuart Mosley is a 68 y.o. male contacted today regarding refills of specialty medication(s) Darolutamide  (Nubeqa )   Patient requested Delivery   Delivery date: 12/09/23   Verified address: [address]   [facility] [place] [address]   Medication will be filled on 12/08/23.

## 2023-12-08 ENCOUNTER — Other Ambulatory Visit: Payer: Self-pay

## 2023-12-30 ENCOUNTER — Other Ambulatory Visit: Payer: Self-pay

## 2023-12-30 ENCOUNTER — Other Ambulatory Visit: Payer: Self-pay | Admitting: Internal Medicine

## 2023-12-30 DIAGNOSIS — C61 Malignant neoplasm of prostate: Secondary | ICD-10-CM

## 2023-12-31 ENCOUNTER — Other Ambulatory Visit: Payer: Self-pay

## 2023-12-31 MED ORDER — NUBEQA 300 MG PO TABS
600.0000 mg | ORAL_TABLET | Freq: Two times a day (BID) | ORAL | 2 refills | Status: AC
  Filled 2023-12-31: qty 120, 30d supply, fill #0
  Filled 2024-01-27: qty 120, 30d supply, fill #1
  Filled 2024-02-25: qty 120, 30d supply, fill #2

## 2024-01-03 ENCOUNTER — Other Ambulatory Visit (HOSPITAL_COMMUNITY): Payer: Self-pay

## 2024-01-05 ENCOUNTER — Other Ambulatory Visit: Payer: Self-pay | Admitting: Pharmacy Technician

## 2024-01-05 ENCOUNTER — Other Ambulatory Visit: Payer: Self-pay

## 2024-01-05 NOTE — Progress Notes (Signed)
 Specialty Pharmacy Refill Coordination Note  Stuart Mosley is a 68 y.o. male contacted today regarding refills of specialty medication(s) Darolutamide  (Nubeqa )   Patient requested Delivery   Delivery date: 01/07/24   Verified address: [address] [facility]    Medication will be filled on: 01/06/24

## 2024-01-19 ENCOUNTER — Other Ambulatory Visit: Payer: Self-pay

## 2024-01-19 NOTE — Progress Notes (Signed)
 Specialty Pharmacy Ongoing Clinical Assessment Note  Stuart Mosley is a 68 y.o. male who is being followed by the specialty pharmacy service for RxSp Oncology   Patient's specialty medication(s) reviewed today: Darolutamide  (Nubeqa )   Missed doses in the last 4 weeks: 0   Patient/Caregiver did not have any additional questions or concerns.   Therapeutic benefit summary: Patient is achieving benefit   Adverse events/side effects summary: No adverse events/side effects   Patient's therapy is appropriate to: Continue    Goals Addressed             This Visit's Progress    Slow Disease Progression   On track    Patient is on track. Patient will maintain adherence. Per labs on 11/16/23, patient's PSA is <0.02 ng/mL.         Follow up: 3 months  [facility]

## 2024-01-27 ENCOUNTER — Other Ambulatory Visit (HOSPITAL_COMMUNITY): Payer: Self-pay

## 2024-02-01 ENCOUNTER — Other Ambulatory Visit: Payer: Self-pay

## 2024-02-01 NOTE — Progress Notes (Signed)
 Specialty Pharmacy Refill Coordination Note  Stuart Mosley is a 68 y.o. male contacted today regarding refills of specialty medication(s) Darolutamide  (Nubeqa )   Patient requested Delivery   Delivery date: 02/07/24   Verified address: [address] [facility]   Medication will be filled on: 02/04/24

## 2024-02-04 ENCOUNTER — Other Ambulatory Visit: Payer: Self-pay

## 2024-02-21 ENCOUNTER — Inpatient Hospital Stay: Admitting: Internal Medicine

## 2024-02-21 ENCOUNTER — Ambulatory Visit: Payer: Self-pay | Admitting: Internal Medicine

## 2024-02-21 ENCOUNTER — Other Ambulatory Visit: Payer: Self-pay | Admitting: Internal Medicine

## 2024-02-21 ENCOUNTER — Encounter: Payer: Self-pay | Admitting: Internal Medicine

## 2024-02-21 ENCOUNTER — Inpatient Hospital Stay: Attending: Internal Medicine

## 2024-02-21 VITALS — BP 114/63 | HR 73 | Temp 97.6°F | Resp 20 | Ht 69.0 in | Wt 290.6 lb

## 2024-02-21 DIAGNOSIS — I129 Hypertensive chronic kidney disease with stage 1 through stage 4 chronic kidney disease, or unspecified chronic kidney disease: Secondary | ICD-10-CM | POA: Insufficient documentation

## 2024-02-21 DIAGNOSIS — C7951 Secondary malignant neoplasm of bone: Secondary | ICD-10-CM | POA: Insufficient documentation

## 2024-02-21 DIAGNOSIS — Z79899 Other long term (current) drug therapy: Secondary | ICD-10-CM | POA: Diagnosis not present

## 2024-02-21 DIAGNOSIS — E1122 Type 2 diabetes mellitus with diabetic chronic kidney disease: Secondary | ICD-10-CM | POA: Insufficient documentation

## 2024-02-21 DIAGNOSIS — N1831 Chronic kidney disease, stage 3a: Secondary | ICD-10-CM | POA: Insufficient documentation

## 2024-02-21 DIAGNOSIS — E559 Vitamin D deficiency, unspecified: Secondary | ICD-10-CM | POA: Diagnosis not present

## 2024-02-21 DIAGNOSIS — C61 Malignant neoplasm of prostate: Secondary | ICD-10-CM | POA: Insufficient documentation

## 2024-02-21 DIAGNOSIS — Z923 Personal history of irradiation: Secondary | ICD-10-CM | POA: Diagnosis not present

## 2024-02-21 LAB — CBC WITH DIFFERENTIAL (CANCER CENTER ONLY)
Abs Immature Granulocytes: 0.03 K/uL (ref 0.00–0.07)
Basophils Absolute: 0 K/uL (ref 0.0–0.1)
Basophils Relative: 0 %
Eosinophils Absolute: 0.2 K/uL (ref 0.0–0.5)
Eosinophils Relative: 3 %
HCT: 38 % — ABNORMAL LOW (ref 39.0–52.0)
Hemoglobin: 12.6 g/dL — ABNORMAL LOW (ref 13.0–17.0)
Immature Granulocytes: 0 %
Lymphocytes Relative: 36 %
Lymphs Abs: 2.8 K/uL (ref 0.7–4.0)
MCH: 33.5 pg (ref 26.0–34.0)
MCHC: 33.2 g/dL (ref 30.0–36.0)
MCV: 101.1 fL — ABNORMAL HIGH (ref 80.0–100.0)
Monocytes Absolute: 0.7 K/uL (ref 0.1–1.0)
Monocytes Relative: 10 %
Neutro Abs: 3.9 K/uL (ref 1.7–7.7)
Neutrophils Relative %: 51 %
Platelet Count: 355 K/uL (ref 150–400)
RBC: 3.76 MIL/uL — ABNORMAL LOW (ref 4.22–5.81)
RDW: 15 % (ref 11.5–15.5)
WBC Count: 7.7 K/uL (ref 4.0–10.5)
nRBC: 0.3 % — ABNORMAL HIGH (ref 0.0–0.2)

## 2024-02-21 LAB — CMP (CANCER CENTER ONLY)
ALT: 33 U/L (ref 0–44)
AST: 30 U/L (ref 15–41)
Albumin: 4.3 g/dL (ref 3.5–5.0)
Alkaline Phosphatase: 47 U/L (ref 38–126)
Anion gap: 11 (ref 5–15)
BUN: 35 mg/dL — ABNORMAL HIGH (ref 8–23)
CO2: 21 mmol/L — ABNORMAL LOW (ref 22–32)
Calcium: 9.4 mg/dL (ref 8.9–10.3)
Chloride: 106 mmol/L (ref 98–111)
Creatinine: 1.81 mg/dL — ABNORMAL HIGH (ref 0.61–1.24)
GFR, Estimated: 40 mL/min — ABNORMAL LOW
Glucose, Bld: 82 mg/dL (ref 70–99)
Potassium: 4.7 mmol/L (ref 3.5–5.1)
Sodium: 138 mmol/L (ref 135–145)
Total Bilirubin: 0.2 mg/dL (ref 0.0–1.2)
Total Protein: 7.2 g/dL (ref 6.5–8.1)

## 2024-02-21 LAB — VITAMIN D 25 HYDROXY (VIT D DEFICIENCY, FRACTURES): Vit D, 25-Hydroxy: 25 ng/mL — ABNORMAL LOW (ref 30–100)

## 2024-02-21 LAB — PSA: Prostatic Specific Antigen: <0.02 ng/mL (ref 0.00–4.00)

## 2024-02-21 MED ORDER — ERGOCALCIFEROL 1.25 MG (50000 UT) PO CAPS: 50000.0000 [IU] | ORAL_CAPSULE | ORAL | 1 refills | Status: AC

## 2024-02-21 NOTE — Assessment & Plan Note (Addendum)
#   JUNE 2025 LOW  volume-LOW RISK- castrate sensitive prostate cancer/rising PSA.  Stage IV -[June 2025-3.5] AUG 2025-PSMA PET scan- recurrent-metastatic to the pelvis [seminal vesicles resected with surgery]; however no evidence of no bone mets or visceral metastasis.  Currently on Eligard + Darolutamide  [SEP 1st, 2025].  Tolerating well.  Await PSA from today.  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 [ Biopsy proven- Dr.Kolluru]- stable  # Genetics: family hx of prostate cancer-  declined  genetic testing-   Eligard - 9/03-2023; same day labs [long drive]; mychart # DISPOSITION: # follow up in 2 months MD; labs- cbc/cmp; PSA; Vit D 25-OH; Eliagrd- - Dr.B

## 2024-02-21 NOTE — Progress Notes (Signed)
"   No concerns today  "

## 2024-02-21 NOTE — Progress Notes (Signed)
 x

## 2024-02-21 NOTE — Progress Notes (Signed)
 [facility] OFFICE PROGRESS NOTE  Patient Care Team: System, Provider Not In as PCP - General Rennie Cindy SAUNDERS, MD as Consulting Physician (Oncology)   Cancer Staging  Prostate cancer [facility]) Staging form: Prostate, AJCC 8th Edition - Clinical: Stage IVA (cTX, cN1, cM0) - Signed by Rennie Cindy SAUNDERS, MD on 10/12/2023    Oncology History Overview Note  Status post RALP August 2015 for Gleason 4+4 had no carcinoma. Surgery performed at [facility] by Dr. Julieann. Final pathology showed a positive margin at the posterior bladder neck and right lateral prostate; seminal vesicles were free of tumor. Pre-op PSA was 16.3. Initial post-op PSA was 0. Underwent IMRT to pelvis completed 06/06/2014. ADT initiated 03/2014. PSA undetectable after completion of radiation initially. On record review, his PSA was 3.0 in August 2024, 2.9 in December 2024, 3.5 in March 2025, and 3.9 in June 2025. He has no bony pain or pelvic pain. He has been incontinent since his prostatectomy, which has not worsened. 1. Intense radiotracer activity within the RIGHT seminal vesicle most consistent with local prostate cancer recurrence. 2. No evidence of metastatic adenopathy in the pelvis or periaortic retroperitoneum. 3. No evidence of visceral metastasis or skeletal metastasis. 4. RIGHT hydronephrosis and hydroureter. Presumed distal ureteral obstruction related to tumor recurrence at the RIGHT seminal Vesicle  # # 2015-prostate cancer s/p prostatectomy [high risk]-status post radiation-followed by ADT 6 months.   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   Prostate cancer (HCC)  10/12/2023 Initial Diagnosis   Prostate cancer (HCC)   10/12/2023 Cancer Staging   Staging form:  Prostate, AJCC 8th Edition - Clinical: Stage IVA (cTX, cN1, cM0) - Signed by Rennie Cindy SAUNDERS, MD on 10/12/2023      HISTORY OF PRESENT ILLNESS: Patient ambulating-independently. Alone.   Stuart Mosley 69 y.o.  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   Discussed the use of AI scribe software for clinical note transcription with the patient, who gave verbal consent to proceed.  History of Present Illness   Stuart Mosley is a 69 year old male with metastatic castrate-sensitive prostate cancer who presents for routine oncology follow-up.  He has metastatic castrate-sensitive prostate cancer, low volume and low risk, currently managed with darolutamide  and Eligard . He reports no nausea, vomiting, skin rash, or diarrhea while on therapy. He reports taking his medications as prescribed. The most recent PSA was less than 0.02; the current PSA is pending. The last Eligard  injection was administered in October 2025.  He also has type 2 diabetes mellitus, managed with Trulicity. Glycemic control has improved, with the most recent hemoglobin A1c at 6.4, previously in the 7s. He reports his kidneys are doing pretty good, with not much change at all.      Review of Systems  Constitutional:  Negative for chills, diaphoresis, fever, malaise/fatigue and weight loss.  HENT:  Negative for nosebleeds and sore throat.   Eyes:  Negative for double vision.  Respiratory:  Negative for cough, hemoptysis, sputum production, shortness of breath and wheezing.   Cardiovascular:  Negative for chest pain, palpitations, orthopnea and leg swelling.  Gastrointestinal:  Negative for abdominal pain, blood in stool, constipation, diarrhea, heartburn, melena, nausea  and vomiting.  Genitourinary:  Negative for dysuria, frequency and urgency.  Musculoskeletal:  Negative  for back pain and joint pain.  Skin: Negative.  Negative for itching and rash.  Neurological:  Negative for dizziness, tingling, focal weakness, weakness and headaches.  Endo/Heme/Allergies:  Does not bruise/bleed easily.  Psychiatric/Behavioral:  Negative for depression. The patient is not nervous/anxious and does not have insomnia.       PAST MEDICAL HISTORY :  Past Medical History:  Diagnosis Date   Anxiety    Chronic kidney disease    Depression    Diabetes mellitus without complication (HCC)    History of kidney stones    Hypertension     PAST SURGICAL HISTORY :   Past Surgical History:  Procedure Laterality Date   HERNIA REPAIR     JOINT REPLACEMENT     left hip   PROSTATECTOMY      FAMILY HISTORY :   Family History  Problem Relation Age of Onset   Diabetes Mother    Diabetes Father        3 sisters living   Healthy Sister    Prostate cancer Brother 21    SOCIAL HISTORY:   Social History   Tobacco Use   Smoking status: Never   Smokeless tobacco: Never  Vaping Use   Vaping status: Never Used  Substance Use Topics   Alcohol use: Not Currently   Drug use: Never    ALLERGIES:  is allergic to penicillins and tetracyclines & related.  MEDICATIONS:  Current Outpatient Medications  Medication Sig Dispense Refill   aspirin EC 81 MG tablet TAKE 1 TABLET BY MOUTH ONCE DAILY.; Duration: 90     Blood Glucose Monitoring Suppl (TEMPO WELCOME) w/Device KIT Use as directed in injet insulin . 1 kit 0   Cholecalciferol 50 MCG (2000 UT) CAPS TAKE 1 CAPSULE BY MOUTH ONCE DAILY.; Duration: 90     darolutamide  (NUBEQA ) 300 MG tablet Take 2 tablets (600 mg total) by mouth 2 (two) times daily with a meal. 120 tablet 2   fenofibrate (TRICOR) 145 MG tablet 1 tablet Orally Once a day; Duration: 90 days     fluticasone (FLONASE) 50 MCG/ACT nasal spray Place into both nostrils daily.     folic acid (FOLVITE) 1 MG tablet Take 1 mg by mouth daily.     glucose blood (ONETOUCH  VERIO) test strip Use to test blood sugar 3 times a day 300 strip 1   glucose blood (ONETOUCH VERIO) test strip use as directed four times daily and as needed 500 each 1   icosapent  Ethyl (VASCEPA ) 1 g capsule Take 1 capsule (1 g total) by mouth 2 (two) times daily. (Patient taking differently: Take 2 g by mouth 2 (two) times daily.) 60 capsule 3   insulin  glargine (LANTUS) 100 UNIT/ML injection Inject 41 Units into the skin daily.     Insulin  Lispro w/ Trans Port (HUMALOG  TEMPO PEN) 100 UNIT/ML SOPN Inject 3 times a day with meals if bs is >150 add 2 units. If >200 add 4 units. If >250 add 6 units If >300 add 6 u (max 540 units a month) 15 mL 5   Insulin  Pen Needle (ULTICARE MINI PEN NEEDLES) 31G X 6 MM MISC See admin instructions.     JARDIANCE 25 MG TABS tablet Take 25 mg by mouth daily.     lisinopril (ZESTRIL) 10 MG tablet Take 10 mg by mouth daily.     metFORMIN (GLUMETZA) 1000 MG (MOD) 24  hr tablet Take 1,000 mg by mouth 2 (two) times daily with a meal.     rosuvastatin (CRESTOR) 5 MG tablet Take 5 mg by mouth daily.     SLOW-MAG 71.5-119 MG TBEC SR tablet TAKE 1 TABLET BY MOUTH TWICE DAILY.; Duration: 90 days (Patient taking differently: 1 tablet 2 (two) times daily.)     TRULICITY 0.75 MG/0.5ML SOAJ INJECT 0.75MG  SUBCUTANEOUSLY WEEKLY; Duration: 30     No current facility-administered medications for this visit.    PHYSICAL EXAMINATION:   BP 114/63 (BP Location: Right Arm, Patient Position: Sitting, Cuff Size: Large)   Pulse 73   Temp 97.6 F (36.4 C) (Tympanic)   Resp 20   Ht 5' 9 (1.753 m)   Wt 290 lb 9.6 oz (131.8 kg)   SpO2 98%   BMI 42.91 kg/m   Filed Weights   02/21/24 1411  Weight: 290 lb 9.6 oz (131.8 kg)    Physical Exam Vitals and nursing note reviewed.  HENT:     Head: Normocephalic and atraumatic.     Mouth/Throat:     Pharynx: Oropharynx is clear.  Eyes:     Extraocular Movements: Extraocular movements intact.     Pupils: Pupils are equal, round, and  reactive to light.  Cardiovascular:     Rate and Rhythm: Normal rate and regular rhythm.  Pulmonary:     Comments: Decreased breath sounds bilaterally.  Abdominal:     Palpations: Abdomen is soft.  Musculoskeletal:        General: Normal range of motion.     Cervical back: Normal range of motion.  Skin:    General: Skin is warm.  Neurological:     General: No focal deficit present.     Mental Status: He is alert and oriented to person, place, and time.  Psychiatric:        Behavior: Behavior normal.        Judgment: Judgment normal.      LABORATORY DATA:  I have reviewed the data as listed    Component Value Date/Time   NA 138 02/21/2024 1413   K 4.7 02/21/2024 1413   CL 106 02/21/2024 1413   CO2 21 (L) 02/21/2024 1413   GLUCOSE 82 02/21/2024 1413   BUN 35 (H) 02/21/2024 1413   CREATININE 1.81 (H) 02/21/2024 1413   CALCIUM 9.4 02/21/2024 1413   PROT 7.2 02/21/2024 1413   ALBUMIN 4.3 02/21/2024 1413   AST 30 02/21/2024 1413   ALT 33 02/21/2024 1413   ALKPHOS 47 02/21/2024 1413   BILITOT 0.2 02/21/2024 1413   GFRNONAA 40 (L) 02/21/2024 1413    No results found for: SPEP, UPEP  Lab Results  Component Value Date   WBC 7.7 02/21/2024   NEUTROABS 3.9 02/21/2024   HGB 12.6 (L) 02/21/2024   HCT 38.0 (L) 02/21/2024   MCV 101.1 (H) 02/21/2024   PLT 355 02/21/2024      Chemistry      Component Value Date/Time   NA 138 02/21/2024 1413   K 4.7 02/21/2024 1413   CL 106 02/21/2024 1413   CO2 21 (L) 02/21/2024 1413   BUN 35 (H) 02/21/2024 1413   CREATININE 1.81 (H) 02/21/2024 1413      Component Value Date/Time   CALCIUM 9.4 02/21/2024 1413   ALKPHOS 47 02/21/2024 1413   AST 30 02/21/2024 1413   ALT 33 02/21/2024 1413   BILITOT 0.2 02/21/2024 1413       RADIOGRAPHIC STUDIES: I have personally  reviewed the radiological images as listed and agreed with the findings in the report. No results found.   ASSESSMENT & PLAN:  Prostate cancer (HCC) # JUNE  2025 LOW  volume-LOW RISK- castrate sensitive prostate cancer/rising PSA.  Stage IV -[June 2025-3.5] AUG 2025-PSMA PET scan- recurrent-metastatic to the pelvis [seminal vesicles resected with surgery]; however no evidence of no bone mets or visceral metastasis.  Currently on Eligard + Darolutamide  [SEP 1st, 2025].  Tolerating well.  Await PSA from today.  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 [ Biopsy proven- Dr.Kolluru]- stable  # Genetics: family hx of prostate cancer-  declined  genetic testing-   Eligard - 9/03-2023; same day labs [long drive]; mychart # DISPOSITION: # follow up in 2 months MD; labs- cbc/cmp; PSA; Vit D 25-OH; Eliagrd- - Dr.B    Orders Placed This Encounter  Procedures   CBC with Differential (Cancer Center Only)    Standing Status:   Future    Expected Date:   04/20/2024    Expiration Date:   07/19/2024   CMP (Cancer Center only)    Standing Status:   Future    Expected Date:   04/20/2024    Expiration Date:   07/19/2024   PSA    Standing Status:   Future    Expected Date:   04/20/2024    Expiration Date:   07/19/2024   VITAMIN D  25 Hydroxy (Vit-D Deficiency, Fractures)    Standing Status:   Future    Expected Date:   04/20/2024    Expiration Date:   07/19/2024   All questions were answered. The patient knows to call the clinic with any problems, questions or concerns.      Cindy JONELLE Joe, MD 02/21/2024 8:37 PM

## 2024-02-22 ENCOUNTER — Other Ambulatory Visit (HOSPITAL_COMMUNITY): Payer: Self-pay

## 2024-02-23 ENCOUNTER — Other Ambulatory Visit: Payer: Self-pay

## 2024-02-25 ENCOUNTER — Other Ambulatory Visit (HOSPITAL_COMMUNITY): Payer: Self-pay

## 2024-02-29 ENCOUNTER — Other Ambulatory Visit (HOSPITAL_COMMUNITY): Payer: Self-pay

## 2024-03-01 ENCOUNTER — Encounter: Payer: Self-pay | Admitting: Internal Medicine

## 2024-03-01 ENCOUNTER — Telehealth: Payer: Self-pay | Admitting: Pharmacy Technician

## 2024-03-01 ENCOUNTER — Other Ambulatory Visit (HOSPITAL_COMMUNITY): Payer: Self-pay

## 2024-03-01 NOTE — Telephone Encounter (Signed)
 Oral Oncology Patient Advocate Encounter   Began application for assistance for Nubeqa  through [employer] [place]  Patient Assistance Foundation.   Application will be submitted upon completion of necessary supporting documentation.   [employer] phone number [phone].   Pending MD signature.  I have all the required information/documents from the patient.   Fredonia Casalino (Patty) Chet Burnet, CPhT  [facility], [place], [facility] - Oral Chemotherapy Patient Advocate Specialist III Phone: [phone]  Fax: [phone]

## 2024-03-02 ENCOUNTER — Other Ambulatory Visit: Payer: Self-pay

## 2024-03-02 ENCOUNTER — Other Ambulatory Visit (HOSPITAL_COMMUNITY): Payer: Self-pay

## 2024-03-02 NOTE — Telephone Encounter (Signed)
 Oral Oncology Patient Advocate Encounter   Submitted application for assistance for Nubeqa to [employer] [place]  Patient [employer].   Application submitted via e-fax to [phone]   [facility] [place]  Patient Assistance Foundation phone number [phone].   I will continue to check the status until final determination.   Traniya Prichett (Patty) Chet Burnet, CPhT  [facility], [place], [facility] - Oral Chemotherapy Patient Advocate Specialist III Phone: [phone]  Fax: [phone]

## 2024-03-07 ENCOUNTER — Other Ambulatory Visit (HOSPITAL_COMMUNITY): Payer: Self-pay

## 2024-03-07 ENCOUNTER — Encounter: Payer: Self-pay | Admitting: Internal Medicine

## 2024-03-08 NOTE — Telephone Encounter (Signed)
 Oral Oncology Patient Advocate Encounter   Received notification that the application for assistance for Nubeqa  through [employer] [place]  Patient Assistance Foundation has been approved.   [employer] [place]  Patient [employer] phone number [phone].   Effective dates: 03/08/2024 through 02/15/2025  Medication will be filled at [facility].  I have spoken to the patient.  Stuart Mosley (Patty) Chet Burnet, CPhT  [facility], [place], [facility] - Oral Chemotherapy Patient Advocate Specialist III Phone: [phone]  Fax: [phone]

## 2024-03-13 ENCOUNTER — Other Ambulatory Visit: Payer: Self-pay | Admitting: Pharmacy Technician

## 2024-03-13 NOTE — Progress Notes (Signed)
 Oral Oncology Patient Advocate Encounter  Patient approved for PAP. Disenrolling.  Stuart Mosley (Patty) Chet Burnet, CPhT  [facility], [place], [facility] - Oral Chemotherapy Patient Advocate Specialist III Phone: [phone]  Fax: [phone]

## 2024-03-21 ENCOUNTER — Other Ambulatory Visit (HOSPITAL_COMMUNITY): Payer: Self-pay

## 2024-03-22 ENCOUNTER — Other Ambulatory Visit: Payer: Self-pay

## 2024-03-23 ENCOUNTER — Other Ambulatory Visit (HOSPITAL_COMMUNITY): Payer: Self-pay

## 2024-03-23 ENCOUNTER — Other Ambulatory Visit: Payer: Self-pay

## 2024-03-23 MED ORDER — GLUCOSE BLOOD VI STRP
ORAL_STRIP | 0 refills | Status: AC
  Filled 2024-03-23: qty 500, 100d supply, fill #0

## 2024-04-20 ENCOUNTER — Inpatient Hospital Stay: Admitting: Internal Medicine

## 2024-04-20 ENCOUNTER — Inpatient Hospital Stay
